# Patient Record
Sex: Male | Born: 1955 | Race: White | Hispanic: No | Marital: Married | Smoking: Former smoker
Health system: Southern US, Community
[De-identification: ages and names within clinical notes are randomized; demographics above are authoritative.]

## PROBLEM LIST (undated history)

## (undated) DIAGNOSIS — I429 Cardiomyopathy, unspecified: Secondary | ICD-10-CM

## (undated) DIAGNOSIS — I2510 Atherosclerotic heart disease of native coronary artery without angina pectoris: Secondary | ICD-10-CM

## (undated) DIAGNOSIS — I639 Cerebral infarction, unspecified: Secondary | ICD-10-CM

## (undated) DIAGNOSIS — F1010 Alcohol abuse, uncomplicated: Secondary | ICD-10-CM

## (undated) DIAGNOSIS — Z720 Tobacco use: Secondary | ICD-10-CM

## (undated) DIAGNOSIS — E119 Type 2 diabetes mellitus without complications: Secondary | ICD-10-CM

## (undated) DIAGNOSIS — I5022 Chronic systolic (congestive) heart failure: Secondary | ICD-10-CM

## (undated) HISTORY — PX: HERNIA REPAIR: SHX51

## (undated) HISTORY — DX: Morbid (severe) obesity due to excess calories: E66.01

## (undated) HISTORY — DX: Type 2 diabetes mellitus without complications: E11.9

## (undated) HISTORY — DX: Tobacco use: Z72.0

## (undated) HISTORY — DX: Cardiomyopathy, unspecified: I42.9

## (undated) HISTORY — DX: Chronic systolic (congestive) heart failure: I50.22

## (undated) HISTORY — DX: Cerebral infarction, unspecified: I63.9

## (undated) HISTORY — DX: Alcohol abuse, uncomplicated: F10.10

## (undated) HISTORY — DX: Atherosclerotic heart disease of native coronary artery without angina pectoris: I25.10

---

## 2005-07-29 ENCOUNTER — Emergency Department: Payer: Self-pay | Admitting: Emergency Medicine

## 2005-08-07 ENCOUNTER — Other Ambulatory Visit: Payer: Self-pay

## 2005-08-10 ENCOUNTER — Ambulatory Visit: Payer: Self-pay | Admitting: Surgery

## 2008-04-16 IMAGING — CT CT ABD-PELV W/ CM
1 of 3 series · 14 of 32 positions shown, 19 images · non-contrast
Comparison: none

REASON FOR EXAM: Abdominal pain PO/IV contrast
COMMENTS:

[Series 2: abdomen · axial · 0.79mm/px · z∈[-598,-166]mm · 14 of 62 slices shown, 19 images]
[im 4/62  soft-tissue]
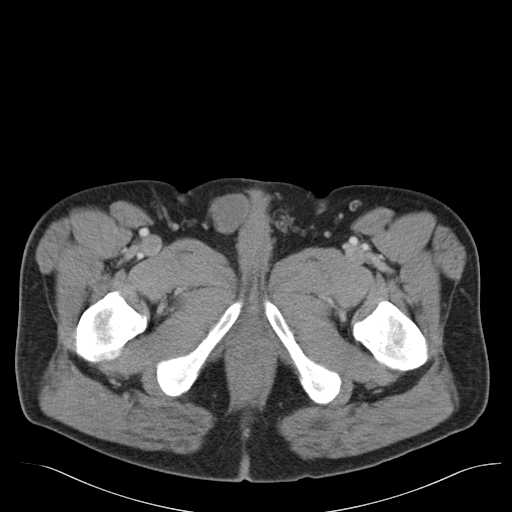
[im 4/62  bone]
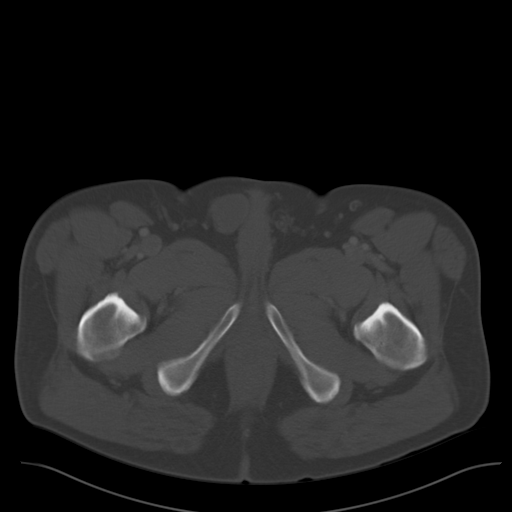
[im 7/62  soft-tissue]
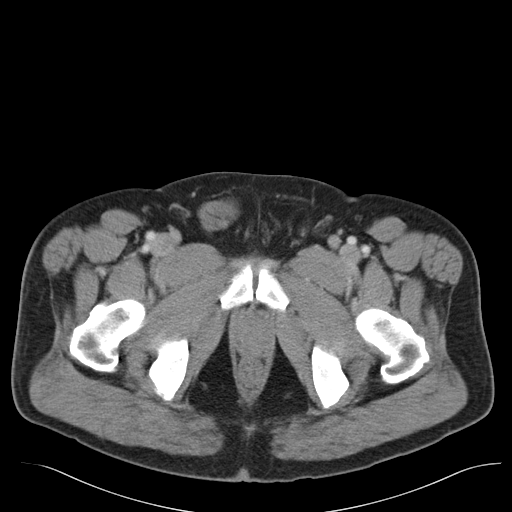
[im 14/62  soft-tissue]
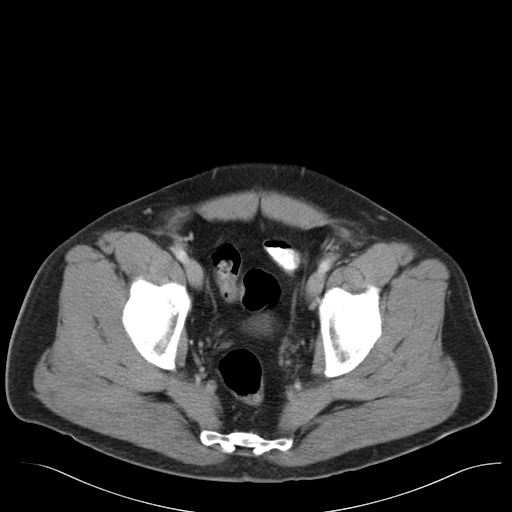
[im 17/62  soft-tissue]
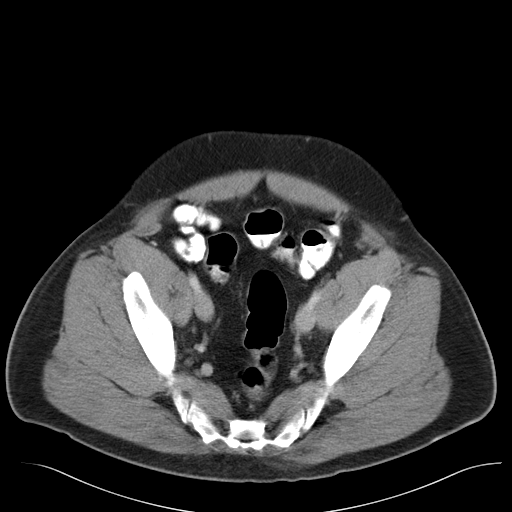
[im 21/62  soft-tissue]
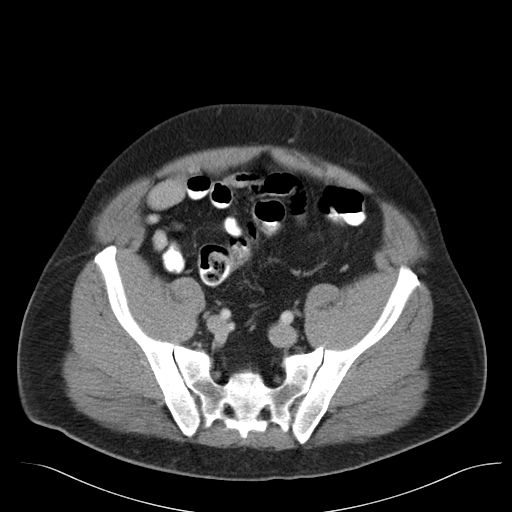
[im 28/62  soft-tissue]
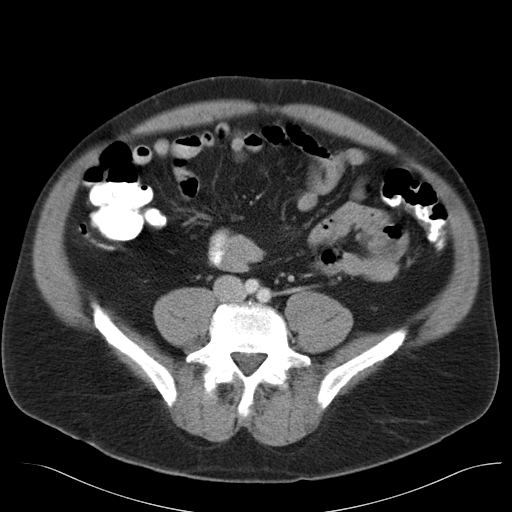
[im 31/62  soft-tissue]
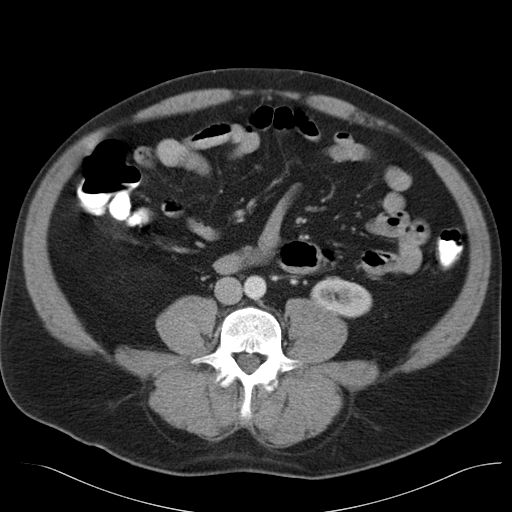
[im 34/62  soft-tissue]
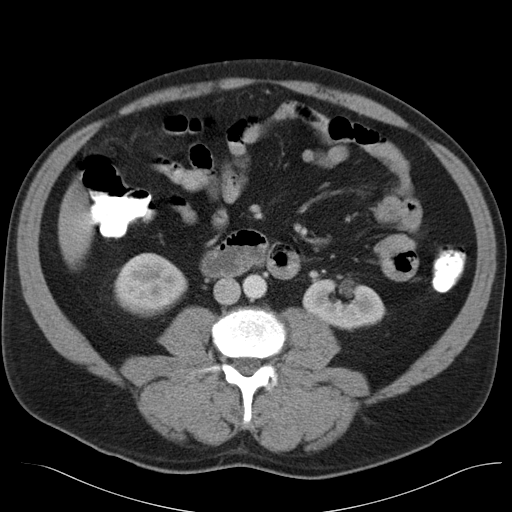
[im 41/62  soft-tissue]
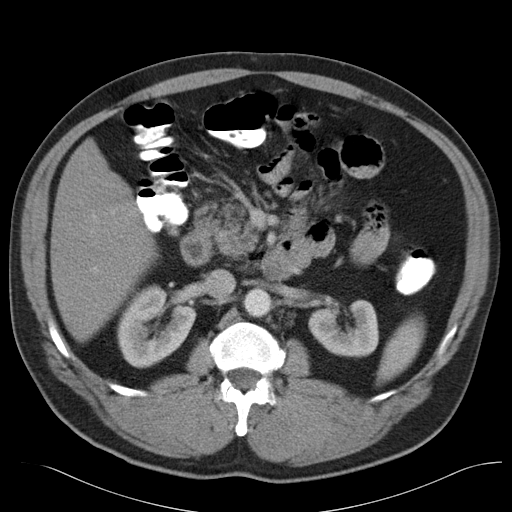
[im 41/62  bone]
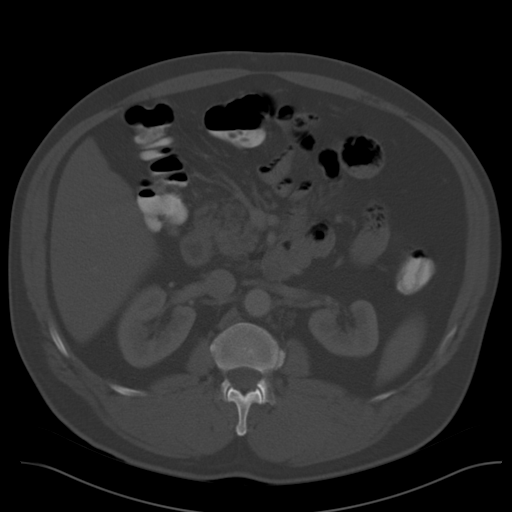
[im 45/62  soft-tissue]
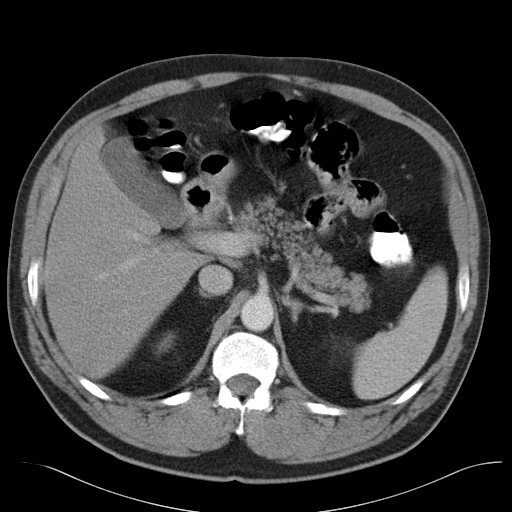
[im 48/62  soft-tissue]
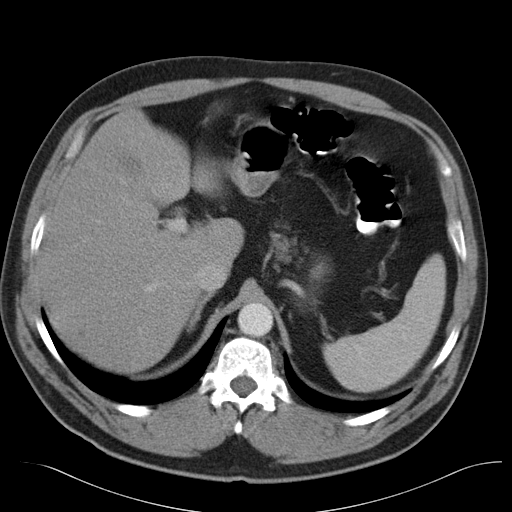
[im 48/62  lung]
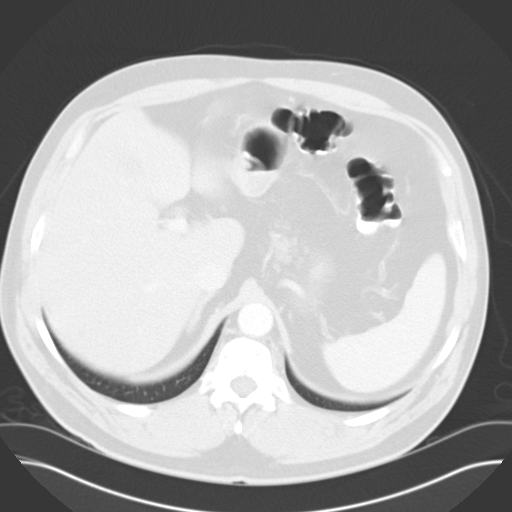
[im 51/62  lung]
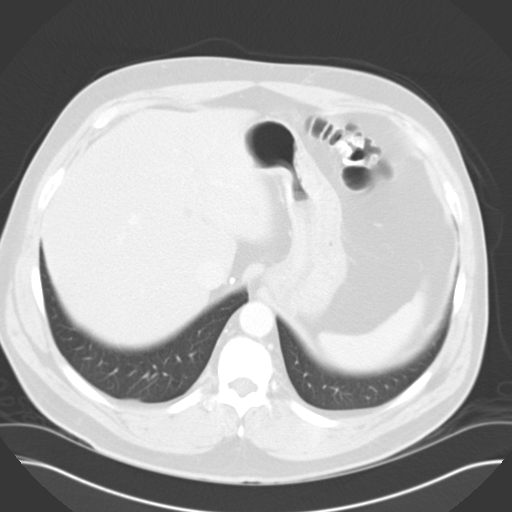
[im 55/62  soft-tissue]
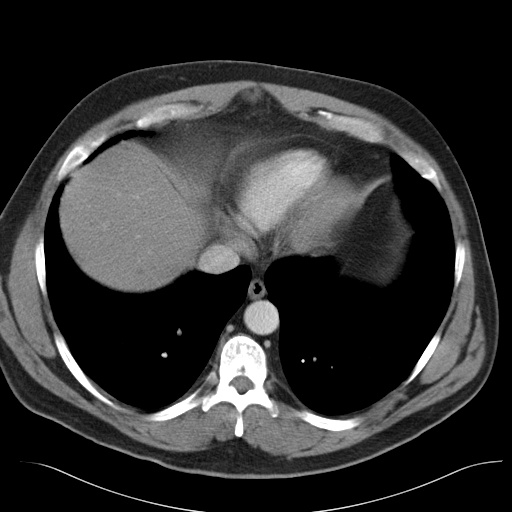
[im 55/62  lung]
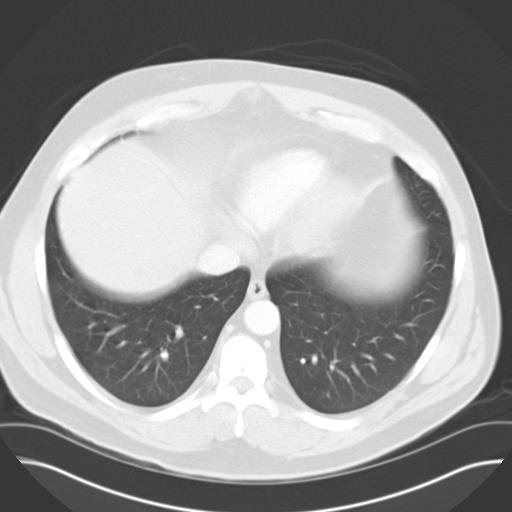
[im 58/62  soft-tissue]
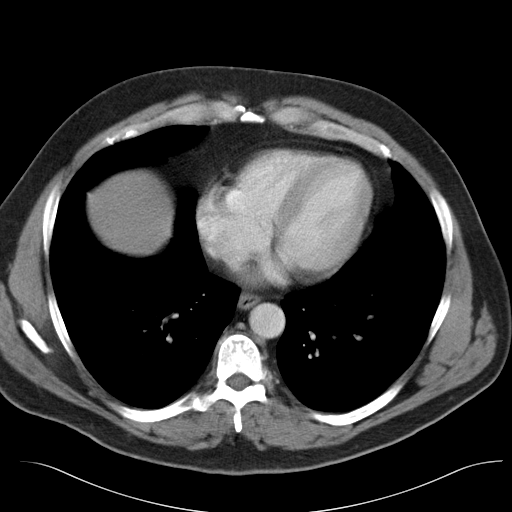
[im 58/62  lung]
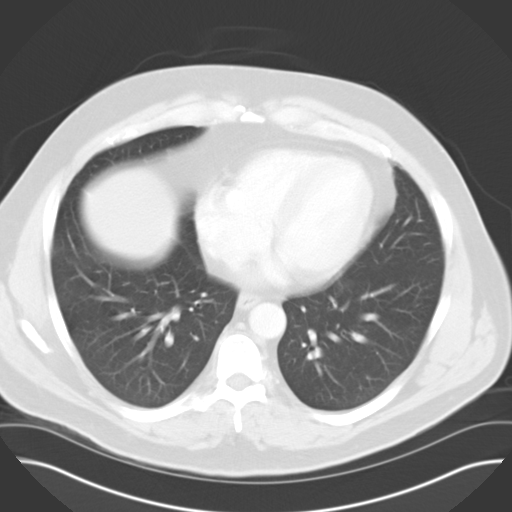

[14 of 32 positions shown; findings below may reference images not displayed]

PROCEDURE:     CT  - CT ABDOMEN / PELVIS  W  - July 29, 2005  [DATE]

RESULT:     IV and oral contrast-enhanced CT scan of the abdomen and pelvis
is performed emergently.  The patient has no prior examination for
comparison.  There is some density in the RIGHT inguinal canal which could
represent an incompletely descended testis or some fluid and fat within a
hernia.  Clinical correlation is recommended.  No abnormal bowel distention
is seen.  There is a fat-containing periumbilical hernia.  The kidneys
enhance normally without evidence of a mass.  No radiopaque gallstones are
seen.  No definite inflammatory changes are evident.  The lung bases are
clear.  There is some fatty infiltration of the pancreas.  The aorta is
normal in caliber. The lung bases are clear.
IMPRESSION: Periumbilical hernia.

Possibly incompletely descended testicle on the RIGHT versus some fluid
within a RIGHT inguinal hernia.  Clinical correlation and follow-up is
recommended.

Not mentioned above are changes of diverticulosis.

No definite evidence of diverticulitis or appendicitis.

## 2008-10-11 ENCOUNTER — Inpatient Hospital Stay: Payer: Self-pay | Admitting: Internal Medicine

## 2009-01-02 ENCOUNTER — Ambulatory Visit: Payer: Self-pay | Admitting: Unknown Physician Specialty

## 2009-10-19 IMAGING — CR DG ABDOMEN 3V
1 series · 4 of 4 positions shown · non-contrast
Comparison: none

REASON FOR EXAM: pain
COMMENTS:

[Series 1: view not recorded · 0.17mm/px · 4 of 4 slices shown]
[im 1/4]
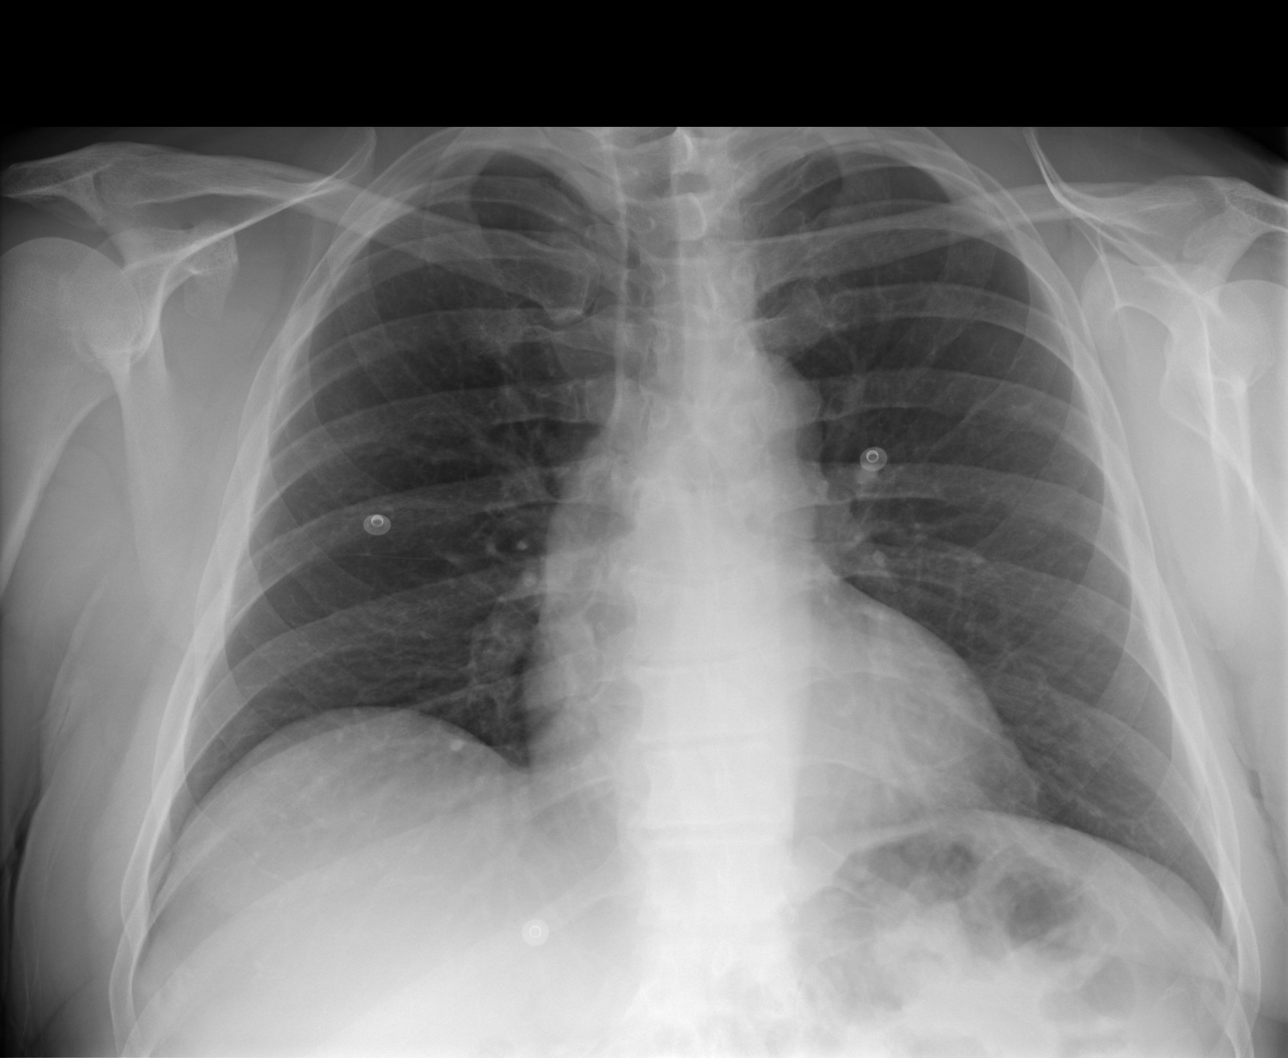
[im 2/4]
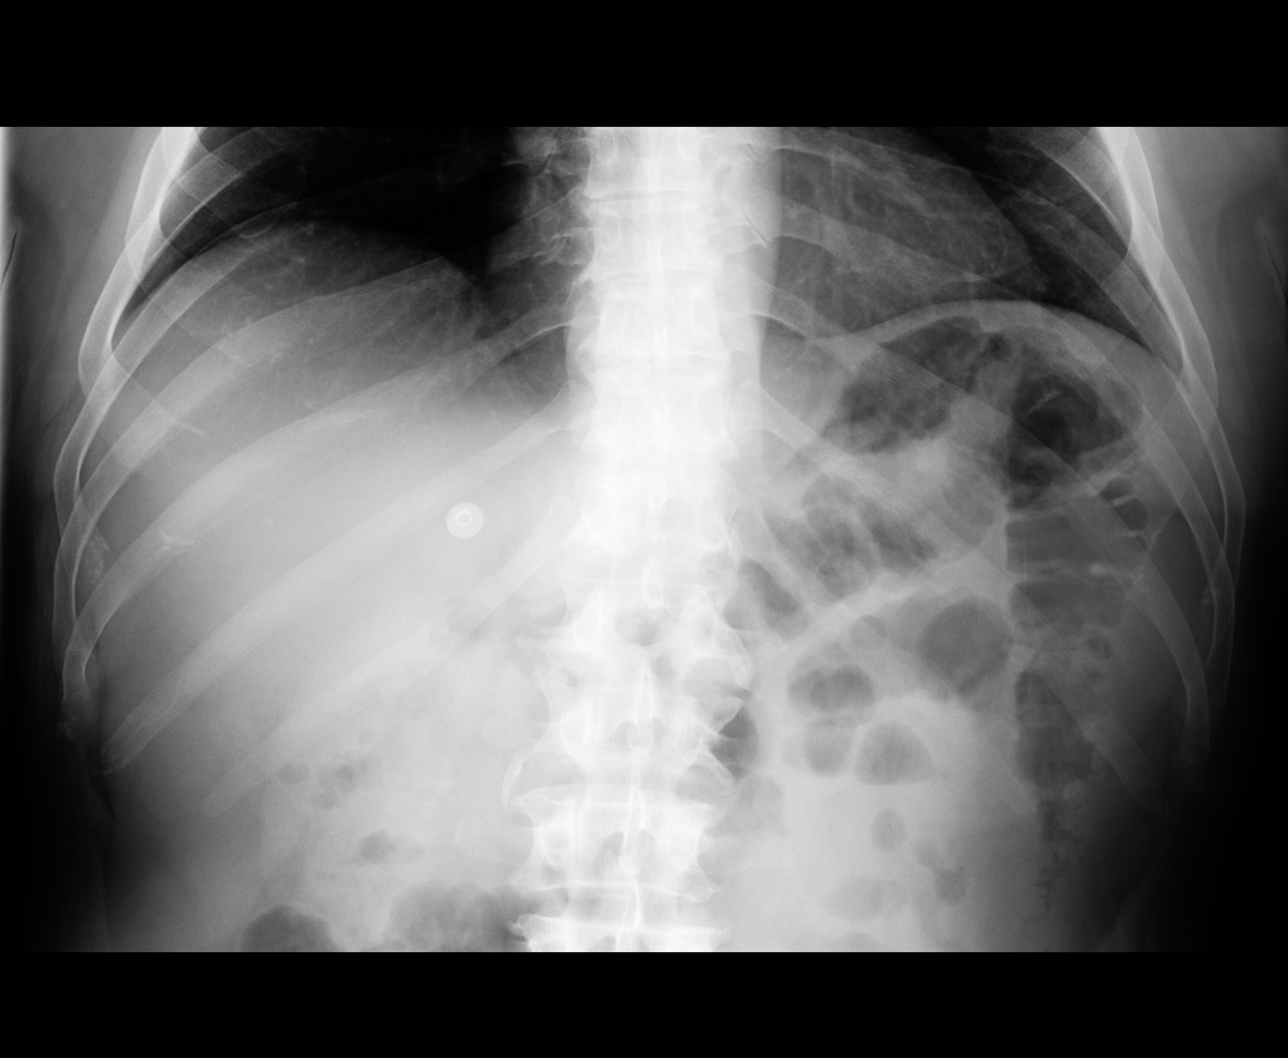
[im 3/4]
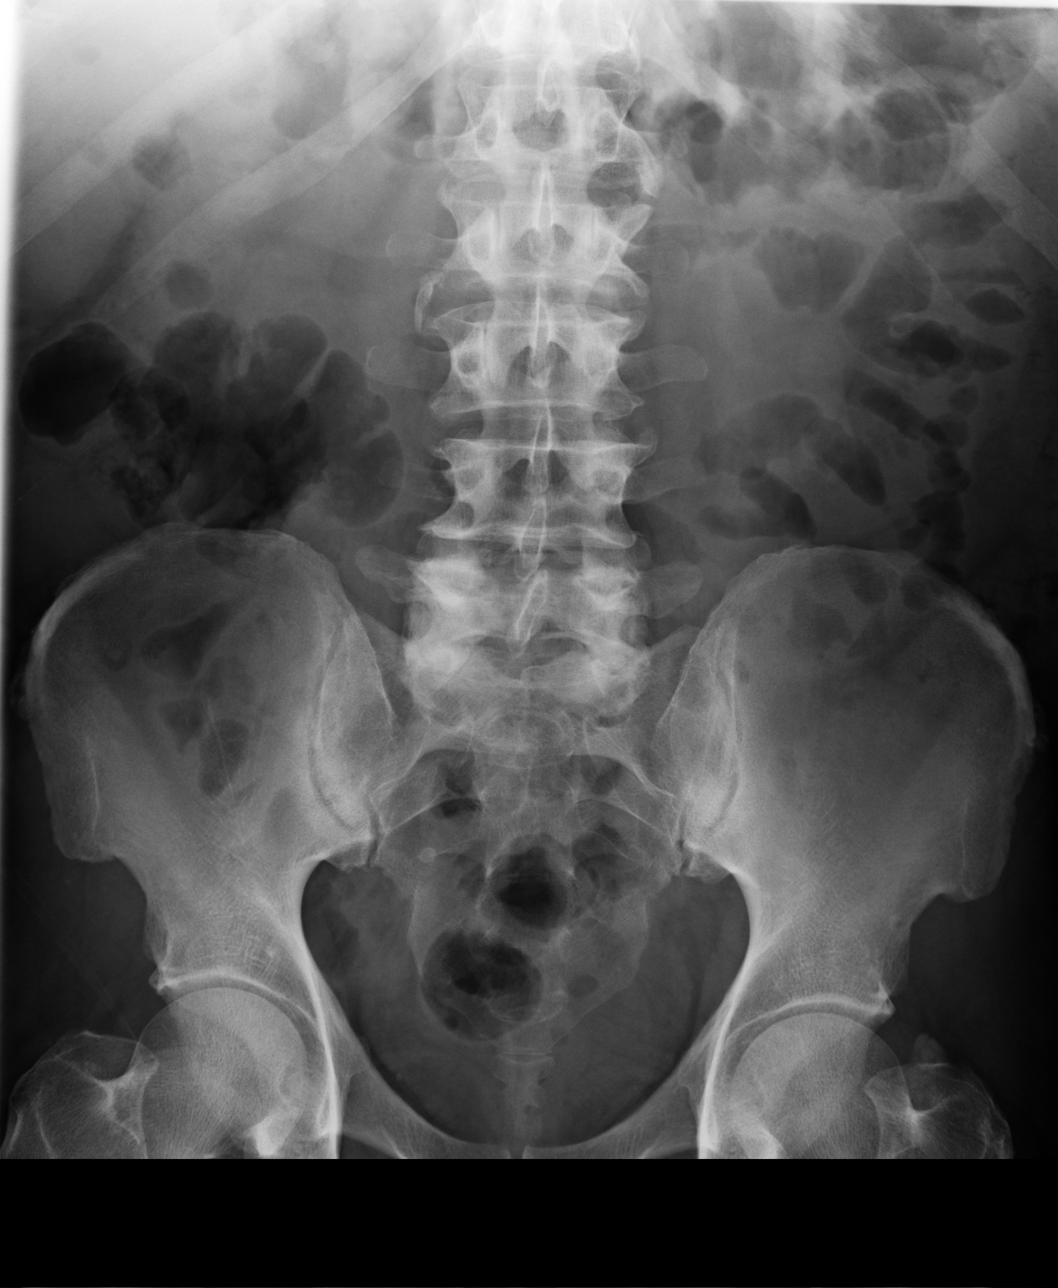
[im 4/4]
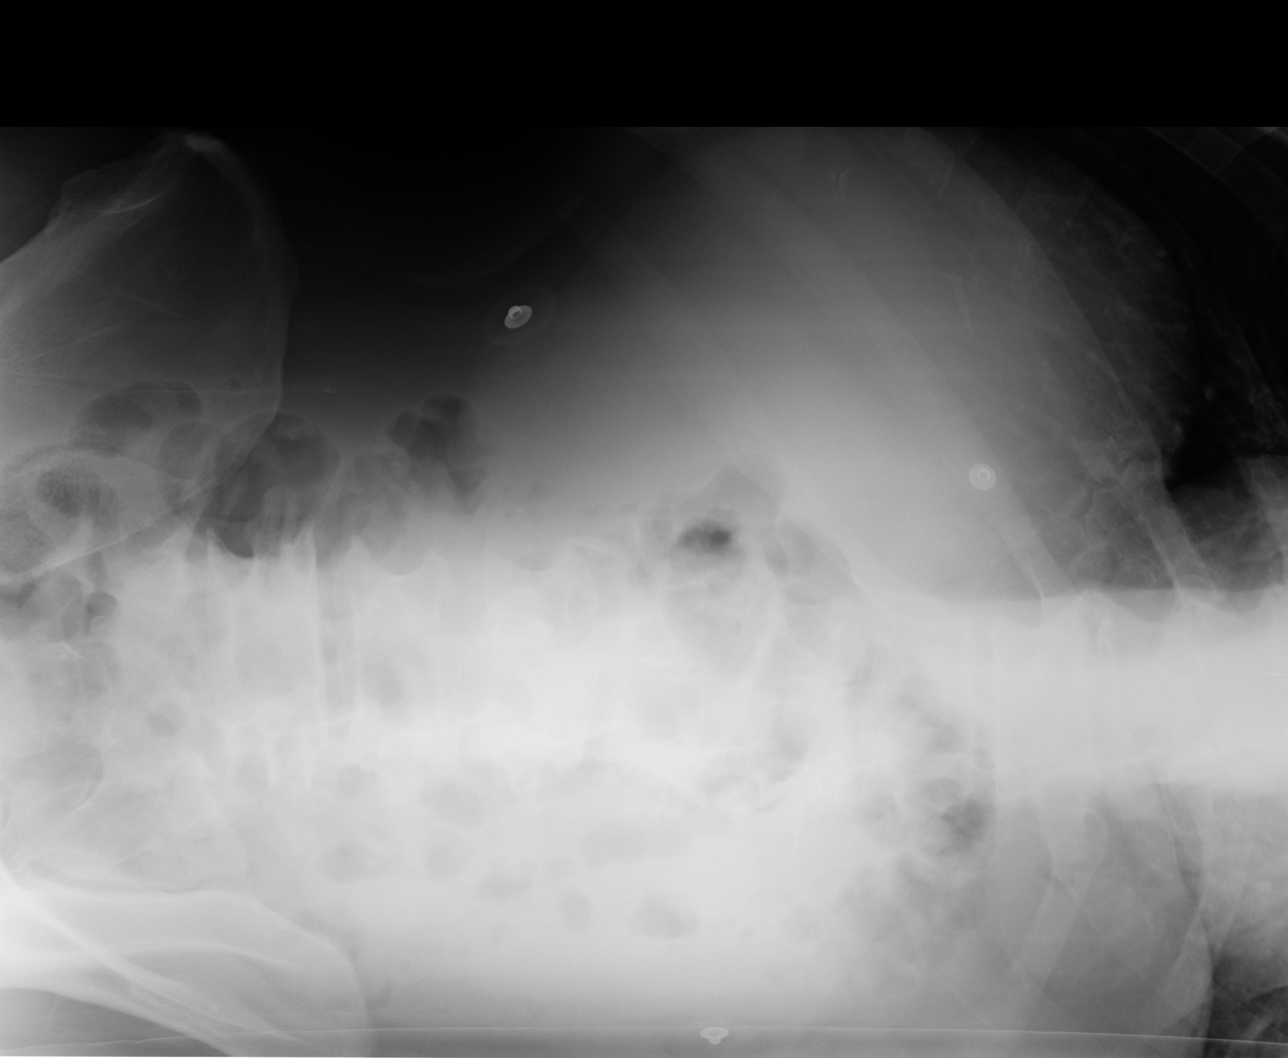

[4 of 4 positions shown; findings below may reference images not displayed]

PROCEDURE:     DXR - DXR ABDOMEN 3-WAY (INCL PA CXR)  - October 11, 2008 [DATE]

RESULT:     PA view of the chest shows the lung fields to be clear.

Three views of the abdomen were obtained. No subdiaphragmatic free air is
seen. The bowel gas pattern is normal. Gas is seen in both the large and
small bowel. No dilated loops of bowel are noted. In the erect view, there
are a few, small, scattered air/fluid levels in nondilated small bowel
loops. Such findings are nonspecific and are of the type frequently seen
with gastroenteritis, enteritis or intra-abdominal inflammation. No abnormal
intra-abdominal calcifications are noted. The osseous structures are normal
in appearance.
IMPRESSION: 1.  The lung fields are clear.
2.  No subdiaphragmatic free air is seen.
3.  There is no evidence of bowel obstruction.
4.  A few scattered small bowel fluid levels are noted in nondilated bowel
loops as mentioned above.

## 2011-11-05 ENCOUNTER — Ambulatory Visit: Payer: Self-pay | Admitting: Medical

## 2011-11-05 LAB — CBC WITH DIFFERENTIAL/PLATELET
Basophil #: 0.1 10*3/uL (ref 0.0–0.1)
Eosinophil #: 0.3 10*3/uL (ref 0.0–0.7)
Eosinophil %: 3.5 %
HGB: 16.2 g/dL (ref 13.0–18.0)
Lymphocyte #: 2.2 10*3/uL (ref 1.0–3.6)
Lymphocyte %: 26 %
MCH: 29.9 pg (ref 26.0–34.0)
MCV: 90 fL (ref 80–100)
Monocyte #: 0.8 x10 3/mm (ref 0.2–1.0)
Monocyte %: 9 %
Neutrophil %: 60.7 %
Platelet: 263 10*3/uL (ref 150–440)
RBC: 5.43 10*6/uL (ref 4.40–5.90)
RDW: 12.9 % (ref 11.5–14.5)

## 2011-11-05 LAB — COMPREHENSIVE METABOLIC PANEL
Albumin: 4.1 g/dL (ref 3.4–5.0)
Alkaline Phosphatase: 38 U/L — ABNORMAL LOW (ref 50–136)
Anion Gap: 13 (ref 7–16)
BUN: 21 mg/dL — ABNORMAL HIGH (ref 7–18)
Bilirubin,Total: 0.6 mg/dL (ref 0.2–1.0)
Calcium, Total: 9.1 mg/dL (ref 8.5–10.1)
Creatinine: 1.06 mg/dL (ref 0.60–1.30)
Glucose: 150 mg/dL — ABNORMAL HIGH (ref 65–99)
Potassium: 4.1 mmol/L (ref 3.5–5.1)
SGOT(AST): 29 U/L (ref 15–37)
Sodium: 140 mmol/L (ref 136–145)
Total Protein: 7.9 g/dL (ref 6.4–8.2)

## 2015-07-02 ENCOUNTER — Ambulatory Visit: Payer: Self-pay | Admitting: Podiatry

## 2015-07-18 ENCOUNTER — Ambulatory Visit (INDEPENDENT_AMBULATORY_CARE_PROVIDER_SITE_OTHER): Payer: BLUE CROSS/BLUE SHIELD

## 2015-07-18 ENCOUNTER — Ambulatory Visit (INDEPENDENT_AMBULATORY_CARE_PROVIDER_SITE_OTHER): Payer: BLUE CROSS/BLUE SHIELD | Admitting: Podiatry

## 2015-07-18 ENCOUNTER — Encounter: Payer: Self-pay | Admitting: Podiatry

## 2015-07-18 VITALS — BP 172/92 | HR 85 | Resp 18

## 2015-07-18 DIAGNOSIS — R52 Pain, unspecified: Secondary | ICD-10-CM

## 2015-07-18 DIAGNOSIS — M2021 Hallux rigidus, right foot: Secondary | ICD-10-CM | POA: Diagnosis not present

## 2015-07-18 NOTE — Progress Notes (Signed)
   Subjective:    Patient ID: Wesley Barry, male    DOB: 04/26/1955, 60 y.o.   MRN: [id number]  HPI  60 year old male presents the also concerns of pain to his right bunion which is been ongoing for the last several months. He is tried changing shoes without any relief in her times daily basis. He states that the toe does get red at times along the bunion site. Denies any recent injury or trauma. No swelling that he's notice. No numbness or tingling. No other complaints at this time.  Review of Systems  All other systems reviewed and are negative.      Objective:   Physical Exam General: AAO x3, NAD  Dermatological: There is mild irritation along the site of the bony prominence of the right bunion. There is no skin breakdown. No fluctuance or crepitus.  Vascular: Dorsalis Pedis artery and Posterior Tibial artery pedal pulses are 2/4 bilateral with immedate capillary fill time. Pedal hair growth present. There is no pain with calf compression, swelling, warmth, erythema.   Neruologic: Grossly intact via light touch bilateral. Vibratory intact via tuning fork bilateral. Protective threshold with Semmes Wienstein monofilament intact to all pedal sites bilateral.   Musculoskeletal: There is a significant decrease in first MPJ range of motion on the right side there is a dorsal prominence of the first metatarsal phalangeal joint. There is crepitation first MPJ range of motion and there is a minimal movement. There is tenderness on the entire first MPJ. There is no other areas of tenderness bilaterally. MMT 5/5, range of motion intact otherwise.  Gait: Unassisted, Nonantalgic.      Assessment & Plan:  60 year old male right hallux rigidus -Treatment options discussed including all alternatives, risks, and complications -Etiology of symptoms were discussed -X-rays were obtained and reviewed with the patient. 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 No evidence of acute  fracture. -I discussed with him both conservative and surgical treatment options. This point he will to have pursue a surgical intervention as he has tried over-the-counter treatment/conservative treatment without any relief. I discussed the most likely first MPJ arthrodesis. Discussed with him the postoperative course as well. He'll discuss this with work and figure out scheduling. He is to call the office for preop consult prior to surgery.  Wesley Barry, DPM

## 2015-07-21 ENCOUNTER — Encounter: Payer: Self-pay | Admitting: Podiatry

## 2015-07-22 ENCOUNTER — Telehealth: Payer: Self-pay | Admitting: *Deleted

## 2015-07-22 NOTE — Telephone Encounter (Signed)
"  I'm Odean's girlfriend.  He wanted him to call you and schedule an appointment for surgery."  He's going to need to schedule an appointment with Dr. Ardelle AntonWagoner for a consult so he can sign consent forms.  Then we can set up a surgery date.  Would you like to schedule that appointment? "Yes, I would."  I'm going to transfer you to a scheduler.

## 2015-07-30 ENCOUNTER — Ambulatory Visit: Payer: BLUE CROSS/BLUE SHIELD | Admitting: Podiatry

## 2015-07-30 ENCOUNTER — Encounter: Payer: Self-pay | Admitting: Podiatry

## 2019-02-03 DIAGNOSIS — Z8619 Personal history of other infectious and parasitic diseases: Secondary | ICD-10-CM

## 2019-02-03 HISTORY — DX: Personal history of other infectious and parasitic diseases: Z86.19

## 2021-05-09 ENCOUNTER — Ambulatory Visit: Payer: Self-pay | Admitting: Cardiology

## 2021-06-10 NOTE — Progress Notes (Signed)
Cardiology Office Note ? ?Date:  06/11/2021  ? ?ID:  Wesley Barry, DOB 1955/09/10, MRN [id number] ? ?PCP:  Patient, No Pcp Per (Inactive)  ? ?Chief Complaint  ?Patient presents with  ? New Patient (Initial Visit)  ?  Self referral for shortness of breath and fluttering in chest. Medications reviewed by the patient verbally.   ? ? ?HPI:  ?[name] is a 66 year old gentleman with past medical history of ?Prior smoker, 50-pack-year history, over 25 years ?Elevated glucose ?Obesity ?Shingles ?Who presents by self-referral for chest pain and fluttering ? ?No significant records in the epic system, followed at [facility] ?Records have been requested ? ?Reports recent chest pain symptoms ?Strong family history of coronary disease, father with CAD ?Presentation mixed secondary to recent history of shingles across the left chest extending under axilla to left posterior shoulder area ? ?Couples months ago, woke up heart was pounding lasted 1 minute and went away without intervention ?Has not had any further episodes since that time ? ?Denies significant leg swelling, no PND orthopnea ?Never been tested for sleep apnea, denies apnea symptoms ? ?Reports he has diet-controlled diabetes ?Does not know what his cholesterol is running, records requested ?Reports that he stopped smoking many years ago ? ?He is currently not on medications ?Does not check his blood pressure at home, running high in the office today ? ?EKG personally reviewed by myself on todays visit ?Normal sinus rhythm rate 93 bpm no significant ST-T wave changes ? ?PMH:   has a past medical history of History of shingles (2021). ? ?PSH:   No past surgical history ? ?Current Outpatient Medications  ?Medication Sig Dispense Refill  ? ivabradine (CORLANOR) 7.5 MG TABS tablet Take 2 tablets (15 mg total) by mouth once for 1 dose. Take 2 hours prior to your CT scan 2 tablet 0  ? metoprolol tartrate (LOPRESSOR) 100 MG tablet Take 1 tablet (100 mg total) by  mouth once for 1 dose. Take two hours prior to your CT. 1 tablet 0  ? ?No current facility-administered medications for this visit.  ? ? ?Allergies:   Tylenol with codeine #3 [acetaminophen-codeine]  ? ?Social History:  The patient  reports that he quit smoking about 19 years ago. His smoking use included cigarettes. He has a 50.00 pack-year smoking history. He does not have any smokeless tobacco history on file. He reports current alcohol use of about 18.0 standard drinks per week. He reports that he does not use drugs.  ? ?Family History:   family history includes Heart attack (age of onset: 92) in his father; Heart attack (age of onset: 55) in his mother.  ? ? ?Review of Systems: ?Review of Systems  ?Constitutional: Negative.   ?HENT: Negative.    ?Respiratory: Negative.    ?Cardiovascular:  Positive for chest pain.  ?Gastrointestinal: Negative.   ?Musculoskeletal: Negative.   ?Neurological: Negative.   ?Psychiatric/Behavioral: Negative.    ?All other systems reviewed and are negative. ? ? ?PHYSICAL EXAM: ?VS:  BP (!) 150/82 (BP Location: Right Arm, Patient Position: Sitting, Cuff Size: Normal)   Pulse 93   Ht 5\' 9"  (1.753 m)   Wt 258 lb (117 kg)   SpO2 97%   BMI 38.10 kg/m?  , BMI Body mass index is 38.1 kg/m?. ?GEN: Well nourished, well developed, in no acute distress ?HEENT: normal ?Neck: no JVD, carotid bruits, or masses ?Cardiac: RRR; no murmurs, rubs, or gallops,no edema  ?Respiratory:  clear to auscultation bilaterally, normal work  of breathing ?GI: soft, nontender, nondistended, + BS ?MS: no deformity or atrophy ?Skin: warm and dry, no rash ?Neuro:  Strength and sensation are intact ?Psych: euthymic mood, full affect ? ?Recent Labs: ?No results found for requested labs within last 8760 hours.  ? ? ?Lipid Panel ?No results found for: CHOL, HDL, LDLCALC, TRIG ?  ? ?Wt Readings from Last 3 Encounters:  ?06/11/21 258 lb (117 kg)  ?  ? ? ? ?ASSESSMENT AND PLAN: ? ?Problem List Items Addressed This Visit    ?None ?Visit Diagnoses   ? ? Chest pain of uncertain etiology    -  Primary  ? Relevant Orders  ? EKG 12-Lead  ? Basic Metabolic Panel (BMET)  ? Fluttering heart      ? Relevant Orders  ? EKG 12-Lead  ? Precordial pain      ? Relevant Orders  ? CT CORONARY MORPH W/CTA COR W/SCORE W/CA W/CM &/OR WO/CM  ? Angina pectoris (HCC)      ? Relevant Medications  ? metoprolol tartrate (LOPRESSOR) 100 MG tablet  ? ivabradine (CORLANOR) 7.5 MG TABS tablet  ? Elevated blood pressure reading      ? ?  ? ?Chest pain/angina ?Strong family history of coronary disease, reports recent left chest pain concerning for angina ?Risk factors include long smoking history, borderline diabetes, ?Discussed first treatment options with him, we have ordered cardiac CTA ?He is not ready to schedule today, will call us back when he would like to schedule, instructions provided ?Long discussion concerning procedure itself ? ?Borderline diabetes ?We have encouraged continued exercise, careful diet management in an effort to lose weight. ?Lab work requested from primary care ? ?Elevated blood pressure reading ?Recommend he closely monitor blood pressure at home and call us if numbers run high ? ?Former smoker ?Denies COPD symptoms ?No significant shortness of breath ? ?Obesity ?Recommended calorie restriction for weight loss ? ? ? Total encounter time more than 60 minutes ? Greater than 50% was spent in counseling and coordination of care with the patient ? ? ? ?Signed, ?Dossie Arbour, M.D., Ph.D. ?[facility] [place], [place] ?[phone] ?

## 2021-06-11 ENCOUNTER — Ambulatory Visit: Payer: Medicare HMO | Admitting: Cardiovascular Disease

## 2021-06-11 ENCOUNTER — Encounter: Payer: Self-pay | Admitting: Cardiovascular Disease

## 2021-06-11 VITALS — BP 150/82 | HR 93 | Ht 69.0 in | Wt 258.0 lb

## 2021-06-11 DIAGNOSIS — I498 Other specified cardiac arrhythmias: Secondary | ICD-10-CM

## 2021-06-11 DIAGNOSIS — R072 Precordial pain: Secondary | ICD-10-CM | POA: Diagnosis not present

## 2021-06-11 DIAGNOSIS — I209 Angina pectoris, unspecified: Secondary | ICD-10-CM | POA: Diagnosis not present

## 2021-06-11 DIAGNOSIS — R079 Chest pain, unspecified: Secondary | ICD-10-CM

## 2021-06-11 DIAGNOSIS — R030 Elevated blood-pressure reading, without diagnosis of hypertension: Secondary | ICD-10-CM

## 2021-06-11 MED ORDER — IVABRADINE HCL 7.5 MG PO TABS
15.0000 mg | ORAL_TABLET | Freq: Once | ORAL | 0 refills | Status: AC
Start: 2021-06-11 — End: 2021-06-11

## 2021-06-11 MED ORDER — METOPROLOL TARTRATE 100 MG PO TABS: 100.0000 mg | ORAL_TABLET | Freq: Once | ORAL | 0 refills | Status: DC

## 2021-06-11 NOTE — Patient Instructions (Addendum)
Medication Instructions:  ?No changes ? ?If you need a refill on your cardiac medications before your next appointment, please call your pharmacy.  ? ?Lab work: ?Today: BMP ? ?[facility] Entr[facility] at [facility] ?1st desk on the right to check in, past the screening table ?Lab hours: Monday- Friday (7:30 am- 5:30 pm)  ? ?Testing/Procedures: ?Your cardiac CT will be scheduled at: ? ?[address] ?[place], [place] [address] ?[phone] ? ?Please arrive 15 mins early for check-in and test prep. ? ? ? ?Please follow these instructions carefully (unless otherwise directed): ? ? ? ?On the Night Before the Test: ?Be sure to Drink plenty of water. ?Do not consume any caffeinated/decaffeinated beverages or chocolate 12 hours prior to your test. ? ? ?On the Day of the Test: ?Drink plenty of water until 1 hour prior to the test. ?Do not eat any food 4 hours prior to the test. ?You may take your regular medications prior to the test.  ?Take metoprolol (Lopressor)100 mg and ivabradine (Corlanor) 15 mg two hours prior to test. ?FEMALES- please wear underwire-free bra if available, avoid dresses & tight clothing ? ?     ?After the Test: ?Drink plenty of water. ?After receiving IV contrast, you may experience a mild flushed feeling. This is normal. ?On occasion, you may experience a mild rash up to 24 hours after the test. This is not dangerous. If this occurs, you can take Benadryl 25 mg and increase your fluid intake. ?If you experience trouble breathing, this can be serious. If it is severe call [phone] IMMEDIATELY. If it is mild, please call our office. ? ?Please allow 2-4 weeks for scheduling of routine cardiac CTs. Some insurance companies require a pre-authorization which may delay scheduling of this test.  ? ?For non-scheduling related questions, please contact the cardiac imaging nurse navigator should you have any questions/concerns: ?Marchia Bond, Cardiac Imaging Nurse  Navigator ?Gordy Clement, Cardiac Imaging Nurse Navigator ?[facility] ?Direct Office Dial: [phone]  ? ?For scheduling needs, including cancellations and rescheduling, please call [place], [phone].   ? ?Follow-Up: ?At [facility], you and your health needs are our priority.  As part of our continuing mission to provide you with exceptional heart care, we have created designated Provider Care Teams.  These Care Teams include your primary Cardiologist (physician) and Advanced Practice Providers (APPs -  Physician Assistants and Nurse Practitioners) who all work together to provide you with the care you need, when you need it. ? ?You will need a follow up appointment as needed ? ?Providers on your designated Care Team:   ?Murray Hodgkins, NP ?Christell Faith, PA-C ?Cadence Kathlen Mody, PA-C ? ?COVID-19 Vaccine Information can be found at: [email or web address] For questions related to vaccine distribution or appointments, please email [email or web address] or call [phone].  ? ?

## 2021-07-10 ENCOUNTER — Encounter (HOSPITAL_COMMUNITY): Payer: Self-pay

## 2021-08-01 ENCOUNTER — Encounter (HOSPITAL_COMMUNITY): Payer: Self-pay

## 2022-06-16 ENCOUNTER — Emergency Department: Payer: Medicare HMO

## 2022-06-16 ENCOUNTER — Inpatient Hospital Stay (HOSPITAL_COMMUNITY)
Admit: 2022-06-16 | Discharge: 2022-06-16 | Disposition: A | Discharge status: Home / Self Care | Payer: Medicare HMO | Attending: Family Medicine | Admitting: Family Medicine

## 2022-06-16 ENCOUNTER — Inpatient Hospital Stay
Admission: EM | Admit: 2022-06-16 | Discharge: 2022-06-19 | DRG: 065 | Disposition: A | Discharge status: Skilled Nursing Facility | Payer: Medicare HMO | Attending: Internal Medicine | Admitting: Internal Medicine

## 2022-06-16 ENCOUNTER — Inpatient Hospital Stay: Payer: Medicare HMO

## 2022-06-16 ENCOUNTER — Encounter: Payer: Self-pay | Admitting: Emergency Medicine

## 2022-06-16 DIAGNOSIS — R29810 Facial weakness: Secondary | ICD-10-CM | POA: Diagnosis present

## 2022-06-16 DIAGNOSIS — R918 Other nonspecific abnormal finding of lung field: Secondary | ICD-10-CM

## 2022-06-16 DIAGNOSIS — E118 Type 2 diabetes mellitus with unspecified complications: Secondary | ICD-10-CM

## 2022-06-16 DIAGNOSIS — I6389 Other cerebral infarction: Secondary | ICD-10-CM

## 2022-06-16 DIAGNOSIS — G8191 Hemiplegia, unspecified affecting right dominant side: Secondary | ICD-10-CM | POA: Diagnosis present

## 2022-06-16 DIAGNOSIS — R531 Weakness: Secondary | ICD-10-CM | POA: Diagnosis present

## 2022-06-16 DIAGNOSIS — Z6836 Body mass index (BMI) 36.0-36.9, adult: Secondary | ICD-10-CM

## 2022-06-16 DIAGNOSIS — Z79899 Other long term (current) drug therapy: Secondary | ICD-10-CM

## 2022-06-16 DIAGNOSIS — I10 Essential (primary) hypertension: Secondary | ICD-10-CM | POA: Diagnosis not present

## 2022-06-16 DIAGNOSIS — I6381 Other cerebral infarction due to occlusion or stenosis of small artery: Principal | ICD-10-CM | POA: Diagnosis present

## 2022-06-16 DIAGNOSIS — R739 Hyperglycemia, unspecified: Secondary | ICD-10-CM

## 2022-06-16 DIAGNOSIS — F1010 Alcohol abuse, uncomplicated: Secondary | ICD-10-CM

## 2022-06-16 DIAGNOSIS — I2510 Atherosclerotic heart disease of native coronary artery without angina pectoris: Secondary | ICD-10-CM | POA: Diagnosis present

## 2022-06-16 DIAGNOSIS — F10139 Alcohol abuse with withdrawal, unspecified: Secondary | ICD-10-CM | POA: Diagnosis present

## 2022-06-16 DIAGNOSIS — Z87891 Personal history of nicotine dependence: Secondary | ICD-10-CM

## 2022-06-16 DIAGNOSIS — Z794 Long term (current) use of insulin: Secondary | ICD-10-CM | POA: Diagnosis not present

## 2022-06-16 DIAGNOSIS — Z885 Allergy status to narcotic agent status: Secondary | ICD-10-CM

## 2022-06-16 DIAGNOSIS — E785 Hyperlipidemia, unspecified: Secondary | ICD-10-CM | POA: Diagnosis present

## 2022-06-16 DIAGNOSIS — R29703 NIHSS score 3: Secondary | ICD-10-CM | POA: Diagnosis present

## 2022-06-16 DIAGNOSIS — I429 Cardiomyopathy, unspecified: Secondary | ICD-10-CM | POA: Diagnosis present

## 2022-06-16 DIAGNOSIS — Z8249 Family history of ischemic heart disease and other diseases of the circulatory system: Secondary | ICD-10-CM | POA: Diagnosis not present

## 2022-06-16 DIAGNOSIS — Z8619 Personal history of other infectious and parasitic diseases: Secondary | ICD-10-CM | POA: Diagnosis not present

## 2022-06-16 DIAGNOSIS — R1310 Dysphagia, unspecified: Secondary | ICD-10-CM | POA: Diagnosis present

## 2022-06-16 DIAGNOSIS — I110 Hypertensive heart disease with heart failure: Secondary | ICD-10-CM | POA: Diagnosis present

## 2022-06-16 DIAGNOSIS — E1165 Type 2 diabetes mellitus with hyperglycemia: Secondary | ICD-10-CM

## 2022-06-16 DIAGNOSIS — R471 Dysarthria and anarthria: Secondary | ICD-10-CM | POA: Diagnosis present

## 2022-06-16 DIAGNOSIS — I5022 Chronic systolic (congestive) heart failure: Secondary | ICD-10-CM | POA: Diagnosis present

## 2022-06-16 DIAGNOSIS — E669 Obesity, unspecified: Secondary | ICD-10-CM | POA: Diagnosis present

## 2022-06-16 DIAGNOSIS — I639 Cerebral infarction, unspecified: Secondary | ICD-10-CM | POA: Diagnosis not present

## 2022-06-16 LAB — CBC
HCT: 47.4 % (ref 39.0–52.0)
Hemoglobin: 15.4 g/dL (ref 13.0–17.0)
MCH: 29 pg (ref 26.0–34.0)
MCHC: 32.5 g/dL (ref 30.0–36.0)
MCV: 89.3 fL (ref 80.0–100.0)
Platelets: 202 10*3/uL (ref 150–400)
RBC: 5.31 MIL/uL (ref 4.22–5.81)
RDW: 12.2 % (ref 11.5–15.5)
WBC: 4.9 10*3/uL (ref 4.0–10.5)
nRBC: 0 % (ref 0.0–0.2)

## 2022-06-16 LAB — HEMOGLOBIN A1C
Hgb A1c MFr Bld: 11.5 % — ABNORMAL HIGH (ref 4.8–5.6)
Mean Plasma Glucose: 283.35 mg/dL

## 2022-06-16 LAB — URINE DRUG SCREEN, QUALITATIVE (ARMC ONLY)
Amphetamines, Ur Screen: NOT DETECTED
Barbiturates, Ur Screen: NOT DETECTED
Benzodiazepine, Ur Scrn: NOT DETECTED
Cannabinoid 50 Ng, Ur ~~LOC~~: NOT DETECTED
Cocaine Metabolite,Ur ~~LOC~~: NOT DETECTED
MDMA (Ecstasy)Ur Screen: NOT DETECTED
Methadone Scn, Ur: NOT DETECTED
Opiate, Ur Screen: NOT DETECTED
Phencyclidine (PCP) Ur S: NOT DETECTED
Tricyclic, Ur Screen: NOT DETECTED

## 2022-06-16 LAB — PROTIME-INR
INR: 1 (ref 0.8–1.2)
Prothrombin Time: 13.5 seconds (ref 11.4–15.2)

## 2022-06-16 LAB — ECHOCARDIOGRAM COMPLETE
AR max vel: 4.09 cm2
AV Area VTI: 5.31 cm2
AV Area mean vel: 3.73 cm2
AV Mean grad: 2.5 mmHg
AV Peak grad: 3.8 mmHg
Ao pk vel: 0.97 m/s
Area-P 1/2: 4.36 cm2
Height: 69 in
MV VTI: 3.94 cm2
S' Lateral: 2.55 cm
Weight: 4160 oz

## 2022-06-16 LAB — COMPREHENSIVE METABOLIC PANEL
ALT: 47 U/L — ABNORMAL HIGH (ref 0–44)
AST: 35 U/L (ref 15–41)
Albumin: 3.8 g/dL (ref 3.5–5.0)
Alkaline Phosphatase: 21 U/L — ABNORMAL LOW (ref 38–126)
Anion gap: 12 (ref 5–15)
BUN: 13 mg/dL (ref 8–23)
CO2: 24 mmol/L (ref 22–32)
Calcium: 8.8 mg/dL — ABNORMAL LOW (ref 8.9–10.3)
Chloride: 98 mmol/L (ref 98–111)
Creatinine, Ser: 0.9 mg/dL (ref 0.61–1.24)
GFR, Estimated: >60 mL/min (ref >60)
Glucose, Bld: 332 mg/dL — ABNORMAL HIGH (ref 70–99)
Potassium: 3.6 mmol/L (ref 3.5–5.1)
Sodium: 134 mmol/L — ABNORMAL LOW (ref 135–145)
Total Bilirubin: 0.9 mg/dL (ref 0.3–1.2)
Total Protein: 7 g/dL (ref 6.5–8.1)

## 2022-06-16 LAB — DIFFERENTIAL
Abs Immature Granulocytes: 0.06 10*3/uL (ref 0.00–0.07)
Basophils Absolute: 0 10*3/uL (ref 0.0–0.1)
Basophils Relative: 1 %
Eosinophils Absolute: 0.1 10*3/uL (ref 0.0–0.5)
Eosinophils Relative: 2 %
Immature Granulocytes: 1 %
Lymphocytes Relative: 38 %
Lymphs Abs: 1.9 10*3/uL (ref 0.7–4.0)
Monocytes Absolute: 0.5 10*3/uL (ref 0.1–1.0)
Monocytes Relative: 11 %
Neutro Abs: 2.3 10*3/uL (ref 1.7–7.7)
Neutrophils Relative %: 47 %

## 2022-06-16 LAB — CBG MONITORING, ED
Glucose-Capillary: 314 mg/dL — ABNORMAL HIGH (ref 70–99)
Glucose-Capillary: 331 mg/dL — ABNORMAL HIGH (ref 70–99)
Glucose-Capillary: 233 mg/dL — ABNORMAL HIGH (ref 70–99)
Glucose-Capillary: 201 mg/dL — ABNORMAL HIGH (ref 70–99)
Glucose-Capillary: 209 mg/dL — ABNORMAL HIGH (ref 70–99)

## 2022-06-16 LAB — ETHANOL: Alcohol, Ethyl (B): <10 mg/dL (ref <10)

## 2022-06-16 LAB — APTT: aPTT: 27 seconds (ref 24–36)

## 2022-06-16 MED ORDER — MIDAZOLAM HCL 2 MG/2ML IJ SOLN: 2.0000 mg | Freq: Once | INTRAMUSCULAR | Status: DC

## 2022-06-16 MED ORDER — ADULT MULTIVITAMIN W/MINERALS CH
1.0000 | ORAL_TABLET | Freq: Every day | ORAL | Status: DC
  Administered 2022-06-17 – 2022-06-19 (×3): 1 via ORAL
  Filled 2022-06-16 (×4): qty 1

## 2022-06-16 MED ORDER — ASPIRIN 81 MG PO TBEC
81.0000 mg | DELAYED_RELEASE_TABLET | Freq: Every day | ORAL | Status: DC
  Administered 2022-06-17 – 2022-06-19 (×3): 81 mg via ORAL
  Filled 2022-06-16 (×3): qty 1

## 2022-06-16 MED ORDER — STROKE: EARLY STAGES OF RECOVERY BOOK: Freq: Once | Status: DC

## 2022-06-16 MED ORDER — THIAMINE MONONITRATE 100 MG PO TABS
100.0000 mg | ORAL_TABLET | Freq: Every day | ORAL | Status: DC
  Administered 2022-06-17 – 2022-06-19 (×3): 100 mg via ORAL
  Filled 2022-06-16 (×3): qty 1

## 2022-06-16 MED ORDER — ATORVASTATIN CALCIUM 20 MG PO TABS
40.0000 mg | ORAL_TABLET | Freq: Every day | ORAL | Status: DC
  Administered 2022-06-17: 40 mg via ORAL
  Filled 2022-06-16 (×2): qty 2

## 2022-06-16 MED ORDER — FOLIC ACID 1 MG PO TABS
1.0000 mg | ORAL_TABLET | Freq: Every day | ORAL | Status: DC
  Administered 2022-06-17 – 2022-06-19 (×3): 1 mg via ORAL
  Filled 2022-06-16 (×4): qty 1

## 2022-06-16 MED ORDER — CLOPIDOGREL BISULFATE 75 MG PO TABS
75.0000 mg | ORAL_TABLET | Freq: Every day | ORAL | Status: DC
  Administered 2022-06-17 – 2022-06-19 (×3): 75 mg via ORAL
  Filled 2022-06-16 (×3): qty 1

## 2022-06-16 MED ORDER — LORAZEPAM 1 MG PO TABS
1.0000 mg | ORAL_TABLET | ORAL | Status: AC | PRN
  Administered 2022-06-18: 2 mg via ORAL
  Filled 2022-06-16: qty 2

## 2022-06-16 MED ORDER — CLOPIDOGREL BISULFATE 75 MG PO TABS
300.0000 mg | ORAL_TABLET | Freq: Once | ORAL | Status: AC
  Administered 2022-06-16: 300 mg via ORAL
  Filled 2022-06-16: qty 4

## 2022-06-16 MED ORDER — LABETALOL HCL 5 MG/ML IV SOLN: 10.0000 mg | INTRAVENOUS | Status: DC | PRN

## 2022-06-16 MED ORDER — ATORVASTATIN CALCIUM 20 MG PO TABS: 80.0000 mg | ORAL_TABLET | Freq: Every day | ORAL | Status: DC

## 2022-06-16 MED ORDER — ASPIRIN 81 MG PO CHEW
324.0000 mg | CHEWABLE_TABLET | Freq: Once | ORAL | Status: AC
  Administered 2022-06-16: 324 mg via ORAL
  Filled 2022-06-16: qty 4

## 2022-06-16 MED ORDER — IOHEXOL 350 MG/ML SOLN
100.0000 mL | Freq: Once | INTRAVENOUS | Status: AC | PRN
  Administered 2022-06-16: 100 mL via INTRAVENOUS

## 2022-06-16 MED ORDER — THIAMINE HCL 100 MG/ML IJ SOLN
100.0000 mg | Freq: Every day | INTRAMUSCULAR | Status: DC
  Administered 2022-06-16: 100 mg via INTRAVENOUS
  Filled 2022-06-16: qty 2

## 2022-06-16 MED ORDER — LORAZEPAM 2 MG/ML IJ SOLN
1.0000 mg | INTRAMUSCULAR | Status: AC | PRN
  Administered 2022-06-16: 1 mg via INTRAVENOUS
  Administered 2022-06-16: 2 mg via INTRAVENOUS
  Administered 2022-06-18: 1 mg via INTRAVENOUS
  Filled 2022-06-16 (×3): qty 1

## 2022-06-16 MED ORDER — INSULIN ASPART 100 UNIT/ML IJ SOLN
0.0000 [IU] | INTRAMUSCULAR | Status: DC
  Administered 2022-06-16: 5 [IU] via SUBCUTANEOUS
  Administered 2022-06-16: 11 [IU] via SUBCUTANEOUS
  Administered 2022-06-16 – 2022-06-17 (×5): 5 [IU] via SUBCUTANEOUS
  Administered 2022-06-17: 8 [IU] via SUBCUTANEOUS
  Administered 2022-06-18: 3 [IU] via SUBCUTANEOUS
  Administered 2022-06-18: 5 [IU] via SUBCUTANEOUS
  Administered 2022-06-18 (×2): 3 [IU] via SUBCUTANEOUS
  Administered 2022-06-18: 5 [IU] via SUBCUTANEOUS
  Administered 2022-06-18: 3 [IU] via SUBCUTANEOUS
  Administered 2022-06-19: 5 [IU] via SUBCUTANEOUS
  Administered 2022-06-19: 3 [IU] via SUBCUTANEOUS
  Administered 2022-06-19: 5 [IU] via SUBCUTANEOUS
  Administered 2022-06-19 (×2): 3 [IU] via SUBCUTANEOUS
  Administered 2022-06-19: 5 [IU] via SUBCUTANEOUS
  Filled 2022-06-16 (×20): qty 1

## 2022-06-16 NOTE — ED Notes (Signed)
Provider at bedside talking to son

## 2022-06-16 NOTE — Evaluation (Signed)
Clinical/Bedside Swallow Evaluation Patient Details  Name: Wesley Barry MRN: [id number] Date of Birth: [birth year]  Today's Date: 06/16/2022 Time: SLP Start Time (ACUTE ONLY): 1515 SLP Stop Time (ACUTE ONLY): 1615 SLP Time Calculation (min) (ACUTE ONLY): 60 min  Past Medical History:  Past Medical History:  Diagnosis Date   History of shingles 2021   Past Surgical History:  Past Surgical History:  Procedure Laterality Date   HERNIA REPAIR     HPI:  Pt is a 67 y.o. male with medical history significant of Alcohol use, Obesity, remote history of tobacco use, history of shingles presenting with new CVA.  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    Assessment / Plan / Recommendation  Clinical Impression   Pt seen for BSE today. pt awake, yawning often. MOD+ Dysarthria impacting speech intelligibility. Pt followed 1-step instructions; repetition tasks. Eager for something to drink. Son present.  On [place] O2 support 1L; afebrile. WBC wnl.  Pt appears to present w/ oropharyngeal phase dysphagia w/ suspected sensorimotor deficits s/p new CVA. Pt is at increased risk for aspiration/aspiration pneumonia. Pt has a Baseline of ETOH abuse/use; unsure of impact on Cognition along w/ the new L CVA. He exhibits MOD+ R oral motor weakness, Dysarthria. Pt required MIN+ tactile/verbal/visual cues for follow through w/ tasks, and swallowing strategies.    During po trials post positioning pt more forward in bed, he consumed trials of single ice chips, Nectar liquids via TSP, and purees. Immediate, overt clinical s/s of aspiration (coughing) noted w/ trials of ice chips; coughing noted w/ sips of thin liquids during [facility] swallow screen by NSG. USING A HEAD TURN RIGHT w/ trials of Nectar liquids by TSP and trials of purees, No immediate, overt clinical s/s  of aspiration were noted: no cough, no decline in respiratory presentation noted during/post trials, clear vocal quality post trials. O2 sats remained 98%. Suspect decreased attention to task as well as potential delayed pharyngeal swallowin initiation could have impacted safety and success of pharyngeal swallow w/ thin liquids. Oral phase was c/b min decreased labial closure on spoon, and slightly slower bolus management but overall functional for A-P transfer and oral clearing of boluses given. Time b/t trials given for pt to fully clear any saliva w/ a Dry swallow, lingual sweeping.  OM exam revealed R lingual/labial unilateral weakness w/ lingual deviation to the R.   Recommend initiation of a dysphagia level 1 diet(purees) w/ Nectar consistency liquids; strict aspiration precautions; Head Turn RIGHT swallow strategy during all po's; time b/t trials for Dry swallowing. Feeding support and Supervision for follow through. Pills Crushed in puree for safety. Reduce Distractions during meals and check for oral clearing during/post intake as needed. NSG/MD updated.  SLP Visit Diagnosis: Dysphagia, oropharyngeal phase (R13.12)    Aspiration Risk  Moderate aspiration risk;Risk for inadequate nutrition/hydration    Diet Recommendation   dysphagia level 1 diet(purees) w/ Nectar consistency liquids; strict aspiration precautions; Head Turn RIGHT swallow strategy during all po's; time b/t trials for Dry swallowing. Feeding support and Supervision for follow through. Reduce Distractions during meals and check for oral clearing during/post intake as needed.   Medication Administration: Crushed with puree    Other  Recommendations Recommended Consults:  (Dietician f/u) Oral Care Recommendations: Oral care BID;Oral care prior to ice chip/H20;Oral care before and after PO;Staff/trained caregiver to provide oral care Caregiver Recommendations:  Avoid jello, ice cream, thin soups, popsicles;Remove water pitcher;Have  oral suction available    Recommendations for follow up therapy are one component of a multi-disciplinary discharge planning process, led by the attending physician.  Recommendations may be updated based on patient status, additional functional criteria and insurance authorization.  Follow up Recommendations Follow physician's recommendations for discharge plan and follow up therapies (next venue of care)      Assistance Recommended at Discharge  full  Functional Status Assessment Patient has had a recent decline in their functional status and demonstrates the ability to make significant improvements in function in a reasonable and predictable amount of time.  Frequency and Duration min 2x/week  2 weeks       Prognosis Prognosis for improved oropharyngeal function: Fair Barriers to Reach Goals: Time post onset;Severity of deficits;Behavior Barriers/Prognosis Comment: ETOH abuse/use      Swallow Study   General Date of Onset: 06/16/22 HPI: Pt is a 67 y.o. male with medical history significant of Alcohol use, Obesity, remote history of tobacco use, history of shingles presenting with new CVA.  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 Type of Study: Bedside Swallow Evaluation Previous Swallow Assessment: none Diet Prior to this Study: NPO Temperature Spikes Noted: No (wbc 4.9) Respiratory Status: Nasal cannula (1L) History of Recent Intubation: No Behavior/Cognition: Alert;Cooperative;Pleasant mood;Distractible;Requires cueing Oral Cavity Assessment: Dry Oral Care Completed by SLP: Yes Oral Cavity - Dentition: Missing dentition;Poor condition Vision: Impaired for self-feeding (Right neglect) Self-Feeding Abilities: Needs assist;Needs set up;Total assist Patient Positioning: Upright in bed (needed positioning) Baseline Vocal  Quality: Low vocal intensity (Dysarthria) Volitional Cough: Strong Volitional Swallow: Able to elicit    Oral/Motor/Sensory Function Overall Oral Motor/Sensory Function: Moderate impairment Facial ROM: Reduced right;Suspected CN VII (facial) dysfunction Facial Symmetry: Abnormal symmetry right;Suspected CN VII (facial) dysfunction Facial Strength: Reduced right;Suspected CN VII (facial) dysfunction Lingual ROM: Reduced right;Suspected CN XII (hypoglossal) dysfunction Lingual Symmetry: Abnormal symmetry right;Suspected CN XII (hypoglossal) dysfunction (deviation to R) Lingual Strength: Reduced;Suspected CN XII (hypoglossal) dysfunction Velum:  (CNT) Mandible: Within Functional Limits   Ice Chips Ice chips: Impaired Oral Phase Impairments: Reduced lingual movement/coordination;Reduced labial seal (on spoon) Pharyngeal Phase Impairments: Cough - Immediate (severe)   Thin Liquid Thin Liquid: Not tested    Nectar Thick Nectar Thick Liquid: Impaired Presentation: Spoon (fed; 12+ trials) Oral Phase Impairments: Reduced labial seal;Reduced lingual movement/coordination Oral phase functional implications:  (fair) Pharyngeal Phase Impairments:  (no immediate cough or s/s)   Honey Thick Honey Thick Liquid: Not tested   Puree Puree: Impaired Presentation: Spoon (fed; 10+ trials) Oral Phase Impairments: Reduced labial seal;Reduced lingual movement/coordination Oral Phase Functional Implications:  (fair) Pharyngeal Phase Impairments:  (none)   Solid     Solid: Not tested         Jerilynn Som, MS, CCC-SLP Speech Language Pathologist Rehab Services; [facility] [phone] (ascom) Wynema Garoutte 06/16/2022,5:57 PM

## 2022-06-16 NOTE — ED Notes (Signed)
Sister and son of pt at bedside. Pt just got back from MRI. Son was crying and began yelling and cursing at this RN. Son demanding that this RN clean mud off pt feet. Informed son that cleaning mud off feet is not the first priority and that medical care needs to be provided first. Son demanding to know what happened. Explained to son that pt had a stroke and has R sided weakness and slurred speech. Provided wet washcloths to son to clean pt feet. Informed son that he may not yell or curse at this RN and that if he continues, security will be called and he will be escorted out.

## 2022-06-16 NOTE — Progress Notes (Signed)
[facility] bedside evaluation by nursing with noted worsening right-sided weakness Seems to be mildly worsening weakness on my evaluation also Noted status post Ativan in the setting of alcohol withdrawals Case discussed with on-call neurologist Dr. Wilford Corner Noted acute perforated infarct of left basal ganglia Per Dr. Wilford Corner, there may be some waxing and waning symptoms in the setting of this type of CVA Nevertheless, we will order CT head noncontrast x 1 to rule out any acute bleeding Dr. Wilford Corner will be following  Patient's son and family member also updated of patient's overall clinical condition at the bedside Family expressed understanding and also apologized to nursing for being aggressive.

## 2022-06-16 NOTE — ED Notes (Signed)
Received report from night RN. Pt was brought back to room from MRI. Pt is refusing MRI. Pt helped to stand and use urinal.

## 2022-06-16 NOTE — Assessment & Plan Note (Signed)
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

## 2022-06-16 NOTE — H&P (Addendum)
History and Physical    Patient: Wesley Barry:[id number] DOB: 03-24-1955 DOA: 06/16/2022 DOS: the patient was seen and examined on 06/16/2022 PCP: Patient, No Pcp Per  Patient coming from: Home  Chief Complaint:  Chief Complaint  Patient presents with   Code Stroke   HPI: Wesley Barry is a 67 y.o. male with medical history significant of alcohol use, obesity, remote history of tobacco use, history of shingles presenting with CVA.  Patient presented to the ER around 5 AM with noted new onset right-sided weakness.  Patient went to bed around 9 PM with no deficits.  No reports of chest pain or shortness of breath.  No nausea or vomiting.  Denies any prior history of stroke symptoms in the past.  No abdominal pain.  No diarrhea.  No headache or reported vision changes.  Positive slurred speech.  Remote history of tobacco use in the past per patient.  Does drink around 12 beers daily.  No reported liquor use.  No reported illicit drug use.  Does not see a doctor on a regular basis.  Was evaluated about a year ago by cardiology for chest pain.  Looks to have declined evaluation and workup. Presented to the ER afebrile, blood pressures 170s to 180s over 60s to 100s.  Satting well on room air.  White count 4.9, hemoglobin 15.4, platelets 202 creatinine 0.9, glucose 332, ALT 47.  Code stroke CT head grossly stable.  CTA head and neck negative for any large vessel occlusion but does show age-indeterminate occlusion of the nondominant atherosclerotic left vertebral artery V4 segment which is favored to be chronic.  Moderate left MCA M1 and bilateral PCA P2 segment stenosis.  MRI of the brain pending.  Patient formally evaluated by teleneurology with recommendation for aspirin and Plavix load as well as further stroke workup. Review of Systems: As mentioned in the history of present illness. All other systems reviewed and are negative. Past Medical History:  Diagnosis Date   History of shingles  2021   Past Surgical History:  Procedure Laterality Date   HERNIA REPAIR     Social History:  reports that he quit smoking about 20 years ago. His smoking use included cigarettes. He has a 50.00 pack-year smoking history. He does not have any smokeless tobacco history on file. He reports current alcohol use of about 18.0 standard drinks of alcohol per week. He reports that he does not use drugs.  Allergies  Allergen Reactions   Tylenol With Codeine #3 [Acetaminophen-Codeine]     Family History  Problem Relation Age of Onset   Heart attack Mother 50   Heart attack Father 75    Prior to Admission medications   Medication Sig Start Date End Date Taking? Authorizing Provider  metoprolol tartrate (LOPRESSOR) 100 MG tablet Take 1 tablet (100 mg total) by mouth once for 1 dose. Take two hours prior to your CT. 06/11/21 06/11/21  Antonieta Iba, MD    Physical Exam: Vitals:   06/16/22 0610 06/16/22 0731  BP: (!) 188/106 (!) 174/60  Pulse: (!) 102 93  Resp: (!) 98   Temp: 98.2 F (36.8 C)   TempSrc: Oral   SpO2: 95%   Weight: 117.9 kg   Height: 5\' 9"  (1.753 m)    Physical Exam Constitutional:      Appearance: He is obese.     Comments: Positive mild generalized perspiration  HENT:     Head: Normocephalic.     Nose: Nose normal.  Eyes:     Pupils: Pupils are equal, round, and reactive to light.  Cardiovascular:     Rate and Rhythm: Normal rate and regular rhythm.  Pulmonary:     Effort: Pulmonary effort is normal.  Abdominal:     General: Bowel sounds are normal.     Comments: Obese abdomen  Musculoskeletal:     Comments: Positive right-sided weakness in upper and lower extremities  Skin:    General: Skin is warm.  Neurological:     Comments: Positive right-sided weakness and right-sided facial droop   Psychiatric:        Mood and Affect: Mood normal.     Data Reviewed:  There are no new results to review at this time. CT HEAD CODE STROKE WO  CONTRAST Addendum: ADDENDUM REPORT: 06/16/2022 06:17   ADDENDUM:  Study discussed by telephone with Dr. Delton Prairie on 06/16/2022 0547  hours today.   Electronically Signed    By: Odessa Fleming M.D.    On: 06/16/2022 06:17 Narrative: CLINICAL DATA:  Code stroke. 67 year old male last known well 2100 hours.  EXAM: CT HEAD WITHOUT CONTRAST  TECHNIQUE: Contiguous axial images were obtained from the base of the skull through the vertex without intravenous contrast.  RADIATION DOSE REDUCTION: This exam was performed according to the departmental dose-optimization program which includes automated exposure control, adjustment of the mA and/or kV according to patient size and/or use of iterative reconstruction technique.  COMPARISON:  None Available.  FINDINGS: Brain: Cerebral volume is within normal limits for age. No midline shift, mass effect, or evidence of intracranial mass lesion. No ventriculomegaly. No acute intracranial hemorrhage identified.  Patchy mild bilateral cerebral white matter hypodensity. Perivascular space left inferior lentiform (normal variant). But mild heterogeneity also in the bilateral thalami and brainstem.  No cortically based acute infarct identified.  Vascular: Extensive Calcified atherosclerosis at the skull base. No suspicious intracranial vascular hyperdensity.  Skull: No acute osseous abnormality identified.  Sinuses/Orbits: Mild paranasal sinus mucosal thickening, primarily in the maxillary sinuses. Tympanic cavities and mastoids are clear.  Other: No gaze deviation, negative orbit and scalp soft tissues.  ASPECTS [facility] Stroke Program Early CT Score)  Total score (0-10 with 10 being normal): 10  IMPRESSION: 1. No acute cortically based infarct or acute intracranial hemorrhage identified. ASPECTS 10. 2. But evidence of some age indeterminate small vessel disease in both hemispheres.  Electronically Signed: By: Odessa Fleming M.D. On:  06/16/2022 05:43 CT ANGIO HEAD NECK W WO CM (CODE STROKE) CLINICAL DATA:  67 year old male with unilateral weakness, facial asymmetry since 2100 hours.  EXAM: CT ANGIOGRAPHY HEAD AND NECK  TECHNIQUE: Multidetector CT imaging of the head and neck was performed using the standard protocol during bolus administration of intravenous contrast. Multiplanar CT image reconstructions and MIPs were obtained to evaluate the vascular anatomy. Carotid stenosis measurements (when applicable) are obtained utilizing NASCET criteria, using the distal internal carotid diameter as the denominator.  RADIATION DOSE REDUCTION: This exam was performed according to the departmental dose-optimization program which includes automated exposure control, adjustment of the mA and/or kV according to patient size and/or use of iterative reconstruction technique.  CONTRAST:  OMNIPAQUE IOHEXOL 350 MG/ML SOLN  COMPARISON:  Plain head CT today 0540 hours. CT Perfusion at the same time reported separately.  FINDINGS: CTA NECK  Skeleton: Carious dentition. Cervical spine degeneration with degenerative C2-C3 facet ankylosis on the left. No acute osseous abnormality identified.  Upper chest: Confluent but sub solid right upper lobe peribronchial  opacity on both series 6, images 169 and 172. This has an infectious/inflammatory appearance. Visible left lung appears negative. Mediastinal lipomatosis. Visible central pulmonary arteries appear patent. No mediastinal lymphadenopathy.  Other neck: No acute finding.  Aortic arch: 3 vessel arch.  Mild Calcified aortic atherosclerosis.  Right carotid system: Mildly tortuous brachiocephalic artery and right CCA origin with mild plaque and no significant stenosis. More moderate soft and calcified plaque of the right CCA at the level of the larynx (series 6, image 134) but less than 50 % stenosis with respect to the distal vessel before the right carotid  bifurcation. At the bifurcation there is calcified atherosclerosis of the right ICA origin and bulb but less than 50 % stenosis with respect to the distal vessel. Right ICA remains patent to the skull base.  Left carotid system: Mildly tortuous left CCA with only mild plaque before the carotid bifurcation. Calcified left ICA origin and bulb with less than 50 % stenosis with respect to the distal vessel.  Vertebral arteries: Normal proximal right subclavian artery. Faint calcified plaque at the right vertebral artery origin without stenosis. Dominant appearing right vertebral artery is patent to the skull base with no additional plaque or stenosis.  Proximal left subclavian artery mild tortuosity and plaque without stenosis. Normal left vertebral artery origin on series 8, image 283. Non dominant left vertebral artery is mildly irregular throughout the neck but patent to the skull base with no significant stenosis identified.  CTA HEAD  Posterior circulation: Dominant right vertebral artery is patent and functionally supplies the basilar. Right PICA origin is normal. Right V4 calcified plaque distal to the PICA is prominent but results in less than 50 % stenosis with respect to the distal vessel.  Contralateral non dominant left vertebral artery is occluded in the distal V4 segment, in an area of calcified plaque (series 9, image 118). Left PICA origin remains patent.  Basilar artery is patent with mild proximal 3rd basilar irregularity and mild stenosis. Basilar tip, SCA and PCA origins are patent. Posterior communicating arteries are diminutive or absent. Mild to moderate irregularity and stenosis of the left PCA P2 segment on series 13 image 25. And similar right PCA proximal P2 segment appearance on image 17. Bilateral distal PCA branches remain patent.  Anterior circulation: Both ICA siphons are patent. Left siphon calcified plaque is moderate to severe in the  supraclinoid segment with associated moderate to severe supraclinoid stenosis on series 9, image 84. Similar contralateral right ICA supraclinoid segment predominant calcified plaque, moderate to severe supraclinoid stenosis on series 9, image 83. But there is also conspicuous right petrous ICA soft and calcified plaque resulting in mild stenosis.  Patent carotid termini, MCA and ACA origins. Mildly tortuous A1 segments. Normal anterior communicating artery. Bilateral ACA branches are within normal limits.  Left MCA M1 segment is patent with mild to moderate irregularity and stenosis on series 11, image 21. Patent left MCA bifurcation. No left MCA branch occlusion is identified but there is up to moderate multifocal M2 and M3 branch irregularity and stenosis on series 13, image 29.  Contralateral right MCA M1 is patent with mild irregularity and stenosis. Patent right MCA bifurcation without stenosis. No right MCA branch occlusion identified, and comparatively mild right MCA branch irregularity.  Venous sinuses: Early contrast timing, not evaluated.  Anatomic variants: Dominant right vertebral artery.  Review of the MIP images confirms the above findings  IMPRESSION: 1. Negative for large vessel occlusion. 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 Legrand Rams this is chronic based on plain Head CT and CT Perfusion (reported separately). - other fairly Extensive Intracranial Atherosclerosis with - Severe bilateral supraclinoid ICA stenoses. - up to Moderate Left MCA M1 stenosis, bilateral PCA P2 segment stenoses. - and left > right mild to moderate MCA branch irregularity and stenosis.  2. Bilateral carotid bifurcation and right vertebral V4 segment calcified atherosclerosis without hemodynamically significant stenosis. Aortic Atherosclerosis (ICD10-I70.0).  3. Partially visible right upper lobe  peribronchial opacity most compatible with Acute Bronchopneumonia.  4. Carious dentition.  Study discussed by telephone with Dr. Delton Prairie on 06/16/2022 at 06:13 .  Electronically Signed   By: Odessa Fleming M.D.   On: 06/16/2022 06:16 CT CEREBRAL PERFUSION W CONTRAST CLINICAL DATA:  67 year old male with unilateral weakness and facial asymmetry since 20/1 100 hours.  EXAM: CT PERFUSION BRAIN  TECHNIQUE: Multiphase CT imaging of the brain was performed following IV bolus contrast injection. Subsequent parametric perfusion maps were calculated using RAPID software.  RADIATION DOSE REDUCTION: This exam was performed according to the departmental dose-optimization program which includes automated exposure control, adjustment of the mA and/or kV according to patient size and/or use of iterative reconstruction technique.  CONTRAST:  OMNIPAQUE IOHEXOL 350 MG/ML SOLN  COMPARISON:  Plain head CT 0540 hours today. CTA head and neck reported separately.  FINDINGS: ASPECTS: 10  CBF (<30%) Volume: 0mL  Perfusion (Tmax>6.0s) volume: 0mL  No CTP parameter abnormality detected.  Mismatch Volume: Not applicable  Infarction Location:Not applicable  IMPRESSION: Negative CT Perfusion.  Electronically Signed   By: Odessa Fleming M.D.   On: 06/16/2022 06:02  Lab Results  Component Value Date   WBC 4.9 06/16/2022   HGB 15.4 06/16/2022   HCT 47.4 06/16/2022   MCV 89.3 06/16/2022   PLT 202 06/16/2022   Last metabolic panel Lab Results  Component Value Date   GLUCOSE 332 (H) 06/16/2022   NA 134 (L) 06/16/2022   K 3.6 06/16/2022   CL 98 06/16/2022   CO2 24 06/16/2022   BUN 13 06/16/2022   CREATININE 0.90 06/16/2022   GFRNONAA >60 06/16/2022   CALCIUM 8.8 (L) 06/16/2022   PROT 7.0 06/16/2022   ALBUMIN 3.8 06/16/2022   BILITOT 0.9 06/16/2022   ALKPHOS 21 (L) 06/16/2022   AST 35 06/16/2022   ALT 47 (H) 06/16/2022   ANIONGAP 12 06/16/2022    Assessment and Plan: * Acute  CVA (cerebrovascular accident) (HCC) Acute right-sided weakness, facial droop and dysarthria since patient waking up this morning Last known normal was around 9 PM yesterday Baseline remote history of tobacco abuse and active alcohol use Code stroke CT head and CT angio of the head and neck within normal limits apart from age indeterminate 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 MRI of the brain pending Teleneurology consulted Appreciate recommendations Status post aspirin and Plavix load DAPT treatment protocol Atorvastatin 40 mg daily Risk stratification labs 2D echo Follow  Hyperglycemia Blood sugars in 300s on presentation Sliding-scale insulin A1c  Hypertension Blood pressure 160s-180s/90s-100s Allow for permissive hypertension in the setting of active CVA As needed IV labetalol for systolic pressure greater than 220 or diastolic pressure greater than 110 Follow  Alcohol abuse Patient reports drinking approximately 12 beers daily Does appear to have some mild withdrawal symptoms in the setting of active CVA with mild diaphoresis and generalized tremor Start CIWA protocol IV thiamine folate and multivitamin Alcohol level within normal  limits Check urine drug screen  Follow  Lung infiltrate on CT Noted Partially visible right upper lobe peribronchial opacity most compatible with Acute Bronchopneumonia on imaging.  No cough, SOB, hypoxia at present Will monitor for now  Reassess if any significant hypoxia, fever, clinical decline Follow closely      Advance Care Planning:   Code Status: Full Code   Consults: Teleneurology   Family Communication: No family at the bedside   Severity of Illness: The appropriate patient status for this patient is OBSERVATION. Observation status is judged to be reasonable and necessary in order to provide the required intensity of service to ensure the patient's safety. The patient's presenting  symptoms, physical exam findings, and initial radiographic and laboratory data in the context of their medical condition is felt to place them at decreased risk for further clinical deterioration. Furthermore, it is anticipated that the patient will be medically stable for discharge from the hospital within 2 midnights of admission.   Author: Floydene Flock, MD 06/16/2022 7:46 AM  For on call review [email or web address].

## 2022-06-16 NOTE — Assessment & Plan Note (Signed)
Blood sugars in 300s on presentation Sliding-scale insulin A1c

## 2022-06-16 NOTE — ED Notes (Signed)
Ordered regular bed for pt as he states he is uncomfortable on stretcher currently.

## 2022-06-16 NOTE — Assessment & Plan Note (Signed)
Blood pressure 160s-180s/90s-100s Allow for permissive hypertension in the setting of active CVA As needed IV labetalol for systolic pressure greater than 220 or diastolic pressure greater than 110 Follow

## 2022-06-16 NOTE — ED Notes (Signed)
UNABLE TO PERFORM SWALLOW SCREEN AT THIS TIME OR GIVE PO MEDS OR PERFORM NIH DUE TO PT SLEEPING AT THIS TIME.

## 2022-06-16 NOTE — ED Notes (Signed)
Messaged provider Alvester Morin to come talk to son.

## 2022-06-16 NOTE — ED Notes (Signed)
1610 Code Stroke cart activated- LKW 2100 last night at bed- pt woke at 4am with R side weakness, slurred speech, and R facial droop. BP 182/100, glucose 314. Pt already in CT when elerted. 66 TS paged 62 Dr Gordy Savers on camera [phone] Pt back from CT  Laurann Montana, RN Telestroke nurse

## 2022-06-16 NOTE — Assessment & Plan Note (Addendum)
Patient reports drinking approximately 12 beers daily Does appear to have some mild withdrawal symptoms in the setting of active CVA with mild diaphoresis and generalized tremor Start CIWA protocol IV thiamine folate and multivitamin Alcohol level within normal limits Check urine drug screen  Follow

## 2022-06-16 NOTE — ED Notes (Signed)
CBG: 209 

## 2022-06-16 NOTE — ED Notes (Signed)
Tried to call MRI to inform pt received ativan.

## 2022-06-16 NOTE — ED Notes (Signed)
Dr Alvester Morin at bedside again, informed him of changes to NIH score.

## 2022-06-16 NOTE — Evaluation (Signed)
Occupational Therapy Evaluation Patient Details Name: Wesley Barry MRN: [id number] DOB: [birth year] Today's Date: 06/16/2022   History of Present Illness Pt is a 67 y.o. male with medical history significant of alcohol use, obesity, remote history of tobacco use, history of shingles presenting with CVA.  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   Clinical Impression   Wesley Barry was seen for OT/PT co-evaluation this date. Prior to hospital admission, pt was IND. Pt lives with girlfriend in mobile home c 3 [address]. Pt presents to acute OT demonstrating impaired ADL performance and functional mobility 2/2 functional impaired use of dominant RUE. Pt currently requires MAX A don B socks seated EOB. MOD A don gown seated EOB. MAX A x2 sup<>sit and sit<>stand at elevated bed height, R knee buckling noted requiring block. Pt would benefit from skilled OT to address noted impairments and functional limitations (see below for any additional details). Upon hospital discharge, recommend follow up therapy <3 hours/day.   Recommendations for follow up therapy are one component of a multi-disciplinary discharge planning process, led by the attending physician.  Recommendations may be updated based on patient status, additional functional criteria and insurance authorization.   Assistance Recommended at Discharge Frequent or constant Supervision/Assistance  Patient can return home with the following Two people to help with walking and/or transfers;Two people to help with bathing/dressing/bathroom;Help with stairs or ramp for entrance    Functional Status Assessment  Patient has had a recent decline in their functional status and demonstrates the ability to make significant improvements in function in a reasonable and predictable amount of time.  Equipment  Recommendations  Other (comment) (defer to next venue of care)    Recommendations for Other Services       Precautions / Restrictions Precautions Precautions: Fall Restrictions Weight Bearing Restrictions: No      Mobility Bed Mobility Overal bed mobility: Needs Assistance Bed Mobility: Supine to Sit, Sit to Supine     Supine to sit: Max assist, +2 for physical assistance Sit to supine: Max assist, +2 for physical assistance   General bed mobility comments: +2 heavy assist for BLE and trunk control    Transfers Overall transfer level: Needs assistance Equipment used: Hemi-walker Transfers: Sit to/from Stand Sit to Stand: Max assist, +2 physical assistance, From elevated surface           General transfer comment: R knee block and heavy assist standing from a significantly elevated surface      Balance Overall balance assessment: Needs assistance Sitting-balance support: Single extremity supported Sitting balance-Leahy Scale: Poor   Postural control: Right lateral lean Standing balance support: Single extremity supported, During functional activity, Reliant on assistive device for balance Standing balance-Leahy Scale: Poor Standing balance comment: Frequent RLE buckling in standing requiring assist/blocking R knee to prevent falls                           ADL either performed or assessed with clinical judgement   ADL Overall ADL's : Needs assistance/impaired                                       General ADL Comments: MAX A don B socks seated EOB. MOD A don gown seated EOB. Anticipate MAX  A x2 for [facility] t/f      Pertinent Vitals/Pain Pain Assessment Pain Assessment: No/denies pain     Hand Dominance     Extremity/Trunk Assessment Upper Extremity Assessment Upper Extremity Assessment: RUE deficits/detail RUE Deficits / Details: 0/5 grossly. pt identifies light touch to R hand as L hand   Lower Extremity Assessment Lower  Extremity Assessment: RLE deficits/detail RLE Deficits / Details: Trace knee ext strength, 2+/5 knee flex, no active DF, 3+/5 PF, sensation to light touch grossly intact but difficult to fully assess secondary to communication deficits       Communication Communication Communication: Expressive difficulties   Cognition Arousal/Alertness: Awake/alert Behavior During Therapy: WFL for tasks assessed/performed Overall Cognitive Status: Difficult to assess                                 General Comments: A&O x4                Home Living Family/patient expects to be discharged to:: Private residence Living Arrangements: Other (Comment) (Girlfriend) Available Help at Discharge: Family;Friend(s) Type of Home: Mobile home Home Access: Stairs to enter Entrance Stairs-Number of Steps: 3 Entrance Stairs-Rails: Right;Left;Can reach both Home Layout: One level                          Prior Functioning/Environment Prior Level of Function : Independent/Modified Independent;Driving             Mobility Comments: Ind, drives ADLs Comments: Ind with ADLs        OT Problem List: Decreased strength;Decreased range of motion;Decreased activity tolerance;Impaired balance (sitting and/or standing);Decreased safety awareness;Impaired UE functional use      OT Treatment/Interventions: Self-care/ADL training;Therapeutic exercise;Energy conservation;DME and/or AE instruction;Therapeutic activities;Balance training;Patient/family education    OT Goals(Current goals can be found in the care plan section) Acute Rehab OT Goals Patient Stated Goal: to return to PLOF OT Goal Formulation: With patient Time For Goal Achievement: 06/30/22 Potential to Achieve Goals: Good ADL Goals Pt Will Perform Eating: with set-up;sitting;with min guard assist;with caregiver independent in assisting Pt Will Perform Upper Body Dressing: sitting;with min assist;with caregiver  independent in assisting Pt Will Transfer to Toilet: with min assist;with +2 assist;bedside commode;stand pivot transfer Pt/caregiver will Perform Home Exercise Program: With minimal assist;Increased ROM;Increased strength;Right Upper extremity  OT Frequency: Min 2X/week    Co-evaluation PT/OT/SLP Co-Evaluation/Treatment: Yes Reason for Co-Treatment: Complexity of the patient's impairments (multi-system involvement);For patient/therapist safety;To address functional/ADL transfers PT goals addressed during session: Mobility/safety with mobility;Proper use of DME OT goals addressed during session: ADL's and self-care      AM-PAC OT "6 Clicks" Daily Activity     Outcome Measure Help from another person eating meals?: A Little Help from another person taking care of personal grooming?: A Little Help from another person toileting, which includes using toliet, bedpan, or urinal?: A Lot Help from another person bathing (including washing, rinsing, drying)?: A Lot Help from another person to put on and taking off regular upper body clothing?: A Lot Help from another person to put on and taking off regular lower body clothing?: A Lot 6 Click Score: 14   End of Session Nurse Communication: Mobility status  Activity Tolerance: Patient tolerated treatment well Patient left: in bed;with call bell/phone within reach;with family/visitor present  OT Visit Diagnosis: Other abnormalities of gait and mobility (R26.89);Muscle weakness (generalized) (M62.81)  Time: 1610-9604 OT Time Calculation (min): 26 min Charges:  OT General Charges $OT Visit: 1 Visit OT Evaluation $OT Eval Moderate Complexity: 1 Mod  Kathie Dike, M.S. OTR/L  06/16/22, 3:55 PM  ascom [phone]

## 2022-06-16 NOTE — ED Notes (Signed)
Dr Alvester Morin on way to talk to pt about MRI. Pt is refusing MRI even when this RN offered versed. Pt in bed.

## 2022-06-16 NOTE — ED Notes (Signed)
Spoke with MRI and informed that pt received ativan and is sleeping and should be able to tolerate MRI now.

## 2022-06-16 NOTE — ED Notes (Signed)
Son apologized for yelling and cursing and is now calm.

## 2022-06-16 NOTE — Consult Note (Signed)
[facility] Tele[facility]urology Consult Services   Patient Name:   Ducharme, Marik Date of Birth:   12/29/1955 Identification Number:   MRN - [id number] Date of Service:   06/16/2022 05:32:13  Diagnosis:       I63.89 - Cerebrovascular accident (CVA) due to other mechanism [facility])  Impression:      This is a 67 year old male who [facility] [place] in the early morning of 06/16/22 by EMS for right facial droop, slurred speech.    He was LKW before bed at 21:00 on 06/15/22. he awoke at ~ 4 AM with the symptoms.  His presentation is consistent with stroke, likely subcorical lacunar. He is outside the window for tPA/TNK. Recommendations below.   Recommendations:        Stroke/Telemetry Floor       Neuro Checks       Bedside Swallow Eval       DVT Prophylaxis       IV Fluids, Normal Saline       Head of Bed 30 Degrees       Euglycemia and Avoid Hyperthermia (PRN Acetaminophen)       Antihypertensives PRN if Blood pressure is greater than 220/120 or there is a concern for End organ damage/contraindications for permissive HTN. If blood pressure is greater than 220/120 give labetalol PO or IV or Vasotec IV with a goal of 15% reduction in BP during the first 24 hours.        Aspirin 325 mg once now, then 81 mg daily.        Also give 300 mg clopidogrel one time, followed by 90 days of clopidogrel 75 mg daily.         While taking clopidogrel, patient should avoid omeprazole or esomeprazole when possible due to possible consequent reduced efficacy of clopidogrel. Other proton pump inhibitors, though, may be acceptable.        Atorvastatin 40 mg daily.        Brain MRI without contrast        TTE in hospital and Holter monitor on discharge.        HbA1C, Lipid Panel, TSH        Lifestyle Modifications: Weight management (goal healthy BMI), smoking cessation if smoker, Mediterranean diet,  exercise (moderate intensive aerobic exercise at least 3 days of the week for 20-60 minutes at a time per [facility] guidelines, or as tolerated), active lifestyle, outpatient goal normotension, outpatient goal LDL <70. PCP to assist with these modifications and goals.  ------------------------------------------------------------------------------  Advanced Imaging: CTA Head and Neck Completed.  CTP Completed.  LVO:No intervenable large vessel occlusion. Left vertebral artery is occluded or not visualized, which is not intervenable given patent distal basilar.   Patient in not a candidate for NIR          Metrics: Last Known Well: 06/15/2022 21:00:00 [facility] Notification Time: 06/16/2022 05:32:13 Arrival Time: 06/16/2022 05:28:00 Stamp Time: 06/16/2022 05:32:13 Initial Response Time: 06/16/2022 05:36:04 Symptoms: right facial droop, slurred speech. Initial patient interaction: 06/16/2022 05:53:00 NIHSS Assessment Completed: 06/16/2022 06:00:00 Patient is not a candidate for Thrombolytic. Thrombolytic Medical Decision: 06/16/2022 06:00:00 Patient was not deemed candidate for Thrombolytic because of following reasons: Last Well Known Above 4.5 Hours.  I personally Reviewed the CT Head and it Showed no hemorrhage. 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 No acute pathology seen.  Primary Provider Notified of Diagnostic Impression and Management Plan on: 06/16/2022 05:41:39    ------------------------------------------------------------------------------  History of Present Illness: Patient is a 67 year old Male.  Patient was brought by EMS for symptoms of right facial droop, slurred speech. This is a 67 year old male who [facility] [place] in the early morning of 06/16/22 by EMS for right facial droop, slurred speech.  He was LKW before bed at 21:00 on 06/15/22. he awoke at ~ 4 AM with the  symptoms.  Of note, he recently saw cardiology for "fluttering" sensation of the heart.   Past Medical History:      Hypertension      Coronary Artery Disease  Medications:  No Anticoagulant use  No Antiplatelet use Reviewed EMR for current medications  Allergies:  Reviewed  Social History: Drug Use: No  Family History:  There is no family history of premature cerebrovascular disease pertinent to this consultation  ROS : 14 Points Review of Systems was performed and was negative except mentioned in HPI.  Past Surgical History: There Is No Surgical History Contributory To Today's Visit    Examination: BP(182/100), Pulse(110), Blood Glucose(314) 1A: Level of Consciousness - Alert; keenly responsive + 0 1B: Ask Month and Age - Both Questions Right + 0 1C: Blink Eyes & Squeeze Hands - Performs Both Tasks + 0 2: Test Horizontal Extraocular Movements - Normal + 0 3: Test Visual Fields - No Visual Loss + 0 4: Test Facial Palsy (Use Grimace if Obtunded) - Minor paralysis (flat nasolabial fold, smile asymmetry) + 1 5A: Test Left Arm Motor Drift - No Drift for 10 Seconds + 0 5B: Test Right Arm Motor Drift - Drift, but doesn't hit bed + 1 6A: Test Left Leg Motor Drift - No Drift for 5 Seconds + 0 6B: Test Right Leg Motor Drift - No Drift for 5 Seconds + 0 7: Test Limb Ataxia (FNF/Heel-Shin) - No Ataxia + 0 8: Test Sensation - Mild-Moderate Loss: Less Sharp/More Dull + 1 9: Test Language/Aphasia - Normal; No aphasia + 0 10: Test Dysarthria - Severe Dysarthria: Unintelligble Slurring or Out of Proportion to Aphasia + 2 11: Test Extinction/Inattention - No abnormality + 0  NIHSS Score: 5   Pre-Morbid Modified Rankin Scale: 0 Points = No symptoms at all  Spoke with : ed provider  Patient/Family was informed the Neurology Consult would occur via TeleHealth consult by way of interactive audio and video telecommunications and consented to receiving care in this  manner.   Patient is being evaluated for possible acute neurologic impairment and high probability of imminent or life-threatening deterioration. I spent total of 40 minutes providing care to this patient, including time for face to face visit via telemedicine, review of medical records, imaging studies and discussion of findings with providers, the patient and/or family.   Dr Flonnie Overman   [facility] For Inpatient follow-up with [facility] physician please call [facility] [phone]. This is not an outpatient service. Post hospital discharge, please contact hospital directly.  Please do not communicate with [facility] physicians via secure chat. If you have any questions, Please contact [facility]. Please call or reconsult our service if there are any clinical or diagnostic changes.

## 2022-06-16 NOTE — ED Notes (Signed)
Pt continues to pull self off cardiac monitor, BP cuff and pulse ox intermittently as states is hot. Pt not excessively diaphoretic or flushed currently. Room temp is slowly decreasing.

## 2022-06-16 NOTE — ED Notes (Signed)
Pt to MRI via stretcher at this time.

## 2022-06-16 NOTE — ED Notes (Signed)
Pt was repositioned in bed. Echocardiogram being performed at bedside.

## 2022-06-16 NOTE — ED Notes (Signed)
Pt's son leaving bedside; son states pt done with his dinner tray. Denies any needs currently.

## 2022-06-16 NOTE — ED Notes (Signed)
Pt to MRI now

## 2022-06-16 NOTE — ED Notes (Signed)
Pt's son at bedside. Pt able to wiggle R foot toes currently; this is improved from NIHSS earlier.

## 2022-06-16 NOTE — ED Notes (Signed)
Placed on 2L oxygen because pt sleeping and snoring with desaturation to 88% on RA.

## 2022-06-16 NOTE — ED Notes (Signed)
Pt switched from stretcher to inpt centrella bed.

## 2022-06-16 NOTE — ED Notes (Signed)
Placed on 1L, SPO2 dropping to 88% on RA.

## 2022-06-16 NOTE — ED Notes (Signed)
Other RN and NT at bedside assisting pt with peri care.

## 2022-06-16 NOTE — Evaluation (Signed)
Physical Therapy Evaluation Patient Details Name: Wesley Barry MRN: [id number] DOB: 02/01/1956 Today's Date: 06/16/2022  History of Present Illness  Pt is a 67 y.o. male with medical history significant of alcohol use, obesity, remote history of tobacco use, history of shingles presenting with CVA.  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   Clinical Impression  Pt presented with significant deficits in expressive communication with son present who assisted with history and PLOF.  Per OT eval and below pt with some strength to his RUE and RLE but not functionally significant at this time.  Pt required extensive +2 physical assistance with bed mobility tasks and to come to standing from an elevated surface with HW support.  Pt made attempts to advance LEs once in standing with cues for sequencing but was unable to advance either LE with R knee quick to buckle with attempts.  Pt will benefit from continued PT services upon discharge to safely address deficits listed in patient problem list for decreased caregiver assistance and eventual return to PLOF.        Recommendations for follow up therapy are one component of a multi-disciplinary discharge planning process, led by the attending physician.  Recommendations may be updated based on patient status, additional functional criteria and insurance authorization.  Follow Up Recommendations Can patient physically be transported by private vehicle: No     Assistance Recommended at Discharge Frequent or constant Supervision/Assistance  Patient can return home with the following  Two people to help with walking and/or transfers;Two people to help with bathing/dressing/bathroom;Assistance with cooking/housework;Direct supervision/assist for medications management;Help with stairs or ramp for  entrance;Assist for transportation    Equipment Recommendations Other (comment) (TBD at next venue of care)  Recommendations for Other Services       Functional Status Assessment Patient has had a recent decline in their functional status and/or demonstrates limited ability to make significant improvements in function in a reasonable and predictable amount of time     Precautions / Restrictions Precautions Precautions: Fall Restrictions Weight Bearing Restrictions: No      Mobility  Bed Mobility Overal bed mobility: Needs Assistance Bed Mobility: Supine to Sit, Sit to Supine     Supine to sit: Max assist, +2 for physical assistance Sit to supine: Max assist, +2 for physical assistance   General bed mobility comments: +2 heavy assist for BLE and trunk control    Transfers Overall transfer level: Needs assistance Equipment used: Hemi-walker Transfers: Sit to/from Stand Sit to Stand: Max assist, +2 physical assistance, From elevated surface           General transfer comment: Mod multi-modal cues for sequencing with R knee blocked and heavy assist to come to standing from a significantly elevated surface    Ambulation/Gait               General Gait Details: Unable to advance either LE  Stairs            Wheelchair Mobility    Modified Rankin (Stroke Patients Only)       Balance Overall balance assessment: Needs assistance Sitting-balance support: Single extremity supported Sitting balance-Leahy Scale: Poor   Postural control: Right lateral lean Standing balance support: Single extremity supported, During functional activity, Reliant on assistive device for balance Standing balance-Leahy Scale: Poor Standing balance comment: Frequent RLE buckling in standing requiring assist/blocking R knee  to prevent falls                             Pertinent Vitals/Pain Pain Assessment Pain Assessment: No/denies pain    Home Living  Family/patient expects to be discharged to:: Private residence Living Arrangements: Other (Comment) (Girlfriend) Available Help at Discharge: Family;Friend(s) Type of Home: Mobile home Home Access: Stairs to enter Entrance Stairs-Rails: Right;Left;Can reach both Entrance Stairs-Number of Steps: 3   Home Layout: One level        Prior Function Prior Level of Function : Independent/Modified Independent             Mobility Comments: Ind amb without an AD, drives ADLs Comments: Ind with ADLs     Hand Dominance        Extremity/Trunk Assessment   Upper Extremity Assessment Upper Extremity Assessment: Defer to OT evaluation    Lower Extremity Assessment Lower Extremity Assessment: RLE deficits/detail RLE Deficits / Details: Trace knee ext strength, 2+/5 knee flex, no active DF, 3+/5 PF, sensation to light touch grossly intact but difficult to fully assess secondary to communication deficits       Communication   Communication: Expressive difficulties  Cognition Arousal/Alertness: Awake/alert Behavior During Therapy: WFL for tasks assessed/performed Overall Cognitive Status: Difficult to assess                                          General Comments      Exercises Other Exercises Other Exercises: L lateral weight shifting in sitting to address R lateral lean   Assessment/Plan    PT Assessment Patient needs continued PT services  PT Problem List Decreased strength;Decreased activity tolerance;Decreased balance;Decreased mobility;Decreased knowledge of use of DME;Decreased safety awareness       PT Treatment Interventions DME instruction;Gait training;Stair training;Functional mobility training;Therapeutic activities;Therapeutic exercise;Balance training;Neuromuscular re-education;Patient/family education    PT Goals (Current goals can be found in the Care Plan section)  Acute Rehab PT Goals PT Goal Formulation: Patient unable to participate  in goal setting Time For Goal Achievement: 06/29/22 Potential to Achieve Goals: Fair    Frequency Min 4X/week     Co-evaluation PT/OT/SLP Co-Evaluation/Treatment: Yes Reason for Co-Treatment: Complexity of the patient's impairments (multi-system involvement);For patient/therapist safety;To address functional/ADL transfers PT goals addressed during session: Mobility/safety with mobility;Proper use of DME         AM-PAC PT "6 Clicks" Mobility  Outcome Measure Help needed turning from your back to your side while in a flat bed without using bedrails?: Total Help needed moving from lying on your back to sitting on the side of a flat bed without using bedrails?: Total Help needed moving to and from a bed to a chair (including a wheelchair)?: Total Help needed standing up from a chair using your arms (e.g., wheelchair or bedside chair)?: Total Help needed to walk in hospital room?: Total Help needed climbing 3-5 steps with a railing? : Total 6 Click Score: 6    End of Session Equipment Utilized During Treatment: Gait belt Activity Tolerance: Patient tolerated treatment well Patient left: in bed;with call bell/phone within reach;with family/visitor present Nurse Communication: Mobility status PT Visit Diagnosis: Unsteadiness on feet (R26.81);Difficulty in walking, not elsewhere classified (R26.2);Muscle weakness (generalized) (M62.81);Hemiplegia and hemiparesis Hemiplegia - Right/Left: Right    Time: 1347-1410 PT Time Calculation (min) (ACUTE ONLY): 23 min  Charges:   PT Evaluation $PT Eval Moderate Complexity: 1 Mod        D. Scott Seleste Tallman PT, DPT 06/16/22, 3:06 PM

## 2022-06-16 NOTE — Progress Notes (Signed)
*  PRELIMINARY RESULTS* Echocardiogram 2D Echocardiogram has been performed.  Cristela Blue 06/16/2022, 1:18 PM

## 2022-06-16 NOTE — Plan of Care (Signed)
Notified of worsening NIH post Ativan with increased right-sided weakness. Has a lacunar infarct which is notorious for waxing and waning.  Noncon head CT to rule out hemorrhagic transformation or acute process. Noncontrast head CT-no hemorrhagic transformation.  No acute change.  Continue recommendations in the initial consult.  Will follow-up  -- Milon Dikes, MD Neurologist [facility] Pager: [phone]

## 2022-06-16 NOTE — ED Triage Notes (Signed)
Pt arrived via [facility] as a CODE STROKE that was activated in field. LKW, 06/15/22 at 2100 when pt went to bed. Symptoms discovered upon awakening at 0400 this morning. Symptoms present: slurred speech, right sided facial droop and right sided weakness. No known blood thinners listed in pts chart. Pt taken directly to CT with tele-neuro activated.

## 2022-06-16 NOTE — ED Provider Notes (Signed)
[facility] Provider Note    Event Date/Time   First MD Initiated Contact with Patient 06/16/22 0530     (approximate)   History   Code Stroke   HPI  Wesley Barry is a 67 y.o. male who presents to the ED for evaluation of Code Stroke   I review a cardiology clinic visit from 1 year ago.  Obese patient with 50-pack-year smoke history.  Patient presents to the ED for evaluation of right-sided weakness.  Reportedly at his baseline when he went to bed at 9 PM last night and awoke with the symptoms.  No falls, syncope or recent illnesses.  No fevers.  Code stroke activated prehospital  Physical Exam   Triage Vital Signs: ED Triage Vitals  Enc Vitals Group     BP      Pulse      Resp      Temp      Temp src      SpO2      Weight      Height      Head Circumference      Peak Flow      Pain Score      Pain Loc      Pain Edu?      Excl. in GC?     Most recent vital signs: Vitals:   06/16/22 0610  BP: (!) 188/106  Pulse: (!) 102  Resp: (!) 98  Temp: 98.2 F (36.8 C)  SpO2: 95%    General: Awake, no distress.  CV:  Good peripheral perfusion.  Resp:  Normal effort.  Abd:  No distention.  MSK:  No deformity noted.  Neuro:  Right-sided weakness and right-sided facial asymmetry is noted.  Some mild dysarthria is present but he is maintaining his airway. Other:     ED Results / Procedures / Treatments   Labs (all labs ordered are listed, but only abnormal results are displayed) Labs Reviewed  COMPREHENSIVE METABOLIC PANEL - Abnormal; Notable for the following components:      Result Value   Sodium 134 (*)    Glucose, Bld 332 (*)    Calcium 8.8 (*)    ALT 47 (*)    Alkaline Phosphatase 21 (*)    All other components within normal limits  CBG MONITORING, ED - Abnormal; Notable for the following components:   Glucose-Capillary 314 (*)    All other components within normal limits  PROTIME-INR  APTT  CBC  DIFFERENTIAL   ETHANOL    EKG Sinus tachycardia the rate of 106 bpm.  Leftward axis.  Normal intervals.  No STEMI.  Nonspecific ST changes are noted.  RADIOLOGY CT head interpreted by me without evidence of acute intracranial pathology  Official radiology report(s): CT HEAD CODE STROKE WO CONTRAST  Addendum Date: 06/16/2022   ADDENDUM REPORT: 06/16/2022 06:17 ADDENDUM: Study discussed by telephone with Dr. Delton Prairie on 06/16/2022 0547 hours today. Electronically Signed   By: Odessa Fleming M.D.   On: 06/16/2022 06:17   Result Date: 06/16/2022 CLINICAL DATA:  Code stroke. 67 year old male last known well 2100 hours. EXAM: CT HEAD WITHOUT CONTRAST TECHNIQUE: Contiguous axial images were obtained from the base of the skull through the vertex without intravenous contrast. RADIATION DOSE REDUCTION: This exam was performed according to the departmental dose-optimization program which includes automated exposure control, adjustment of the mA and/or kV according to patient size and/or use of iterative reconstruction technique. COMPARISON:  None Available. FINDINGS:  Brain: Cerebral volume is within normal limits for age. No midline shift, mass effect, or evidence of intracranial mass lesion. No ventriculomegaly. No acute intracranial hemorrhage identified. Patchy mild bilateral cerebral white matter hypodensity. Perivascular space left inferior lentiform (normal variant). But mild heterogeneity also in the bilateral thalami and brainstem. No cortically based acute infarct identified. Vascular: Extensive Calcified atherosclerosis at the skull base. No suspicious intracranial vascular hyperdensity. Skull: No acute osseous abnormality identified. Sinuses/Orbits: Mild paranasal sinus mucosal thickening, primarily in the maxillary sinuses. Tympanic cavities and mastoids are clear. Other: No gaze deviation, negative orbit and scalp soft tissues. ASPECTS [facility] Stroke Program Early CT Score) Total score (0-10 with 10 being normal): 10  IMPRESSION: 1. No acute cortically based infarct or acute intracranial hemorrhage identified. ASPECTS 10. 2. But evidence of some age indeterminate small vessel disease in both hemispheres. Electronically Signed: By: Odessa Fleming M.D. On: 06/16/2022 05:43   CT ANGIO HEAD NECK W WO CM (CODE STROKE)  Result Date: 06/16/2022 CLINICAL DATA:  67 year old male with unilateral weakness, facial asymmetry since 2100 hours. EXAM: CT ANGIOGRAPHY HEAD AND NECK TECHNIQUE: Multidetector CT imaging of the head and neck was performed using the standard protocol during bolus administration of intravenous contrast. Multiplanar CT image reconstructions and MIPs were obtained to evaluate the vascular anatomy. Carotid stenosis measurements (when applicable) are obtained utilizing NASCET criteria, using the distal internal carotid diameter as the denominator. RADIATION DOSE REDUCTION: This exam was performed according to the departmental dose-optimization program which includes automated exposure control, adjustment of the mA and/or kV according to patient size and/or use of iterative reconstruction technique. CONTRAST:  OMNIPAQUE IOHEXOL 350 MG/ML SOLN COMPARISON:  Plain head CT today 0540 hours. CT Perfusion at the same time reported separately. FINDINGS: CTA NECK Skeleton: Carious dentition. Cervical spine degeneration with degenerative C2-C3 facet ankylosis on the left. No acute osseous abnormality identified. Upper chest: Confluent but sub solid right upper lobe peribronchial opacity on both series 6, images 169 and 172. This has an infectious/inflammatory appearance. Visible left lung appears negative. Mediastinal lipomatosis. Visible central pulmonary arteries appear patent. No mediastinal lymphadenopathy. Other neck: No acute finding. Aortic arch: 3 vessel arch.  Mild Calcified aortic atherosclerosis. Right carotid system: Mildly tortuous brachiocephalic artery and right CCA origin with mild plaque and no significant  stenosis. More moderate soft and calcified plaque of the right CCA at the level of the larynx (series 6, image 134) but less than 50 % stenosis with respect to the distal vessel before the right carotid bifurcation. At the bifurcation there is calcified atherosclerosis of the right ICA origin and bulb but less than 50 % stenosis with respect to the distal vessel. Right ICA remains patent to the skull base. Left carotid system: Mildly tortuous left CCA with only mild plaque before the carotid bifurcation. Calcified left ICA origin and bulb with less than 50 % stenosis with respect to the distal vessel. Vertebral arteries: Normal proximal right subclavian artery. Faint calcified plaque at the right vertebral artery origin without stenosis. Dominant appearing right vertebral artery is patent to the skull base with no additional plaque or stenosis. Proximal left subclavian artery mild tortuosity and plaque without stenosis. Normal left vertebral artery origin on series 8, image 283. Non dominant left vertebral artery is mildly irregular throughout the neck but patent to the skull base with no significant stenosis identified. CTA HEAD Posterior circulation: Dominant right vertebral artery is patent and functionally supplies the basilar. Right PICA origin is normal. Right V4  calcified plaque distal to the PICA is prominent but results in less than 50 % stenosis with respect to the distal vessel. Contralateral non dominant left vertebral artery is occluded in the distal V4 segment, in an area of calcified plaque (series 9, image 118). Left PICA origin remains patent. Basilar artery is patent with mild proximal 3rd basilar irregularity and mild stenosis. Basilar tip, SCA and PCA origins are patent. Posterior communicating arteries are diminutive or absent. Mild to moderate irregularity and stenosis of the left PCA P2 segment on series 13 image 25. And similar right PCA proximal P2 segment appearance on image 17. Bilateral  distal PCA branches remain patent. Anterior circulation: Both ICA siphons are patent. Left siphon calcified plaque is moderate to severe in the supraclinoid segment with associated moderate to severe supraclinoid stenosis on series 9, image 84. Similar contralateral right ICA supraclinoid segment predominant calcified plaque, moderate to severe supraclinoid stenosis on series 9, image 83. But there is also conspicuous right petrous ICA soft and calcified plaque resulting in mild stenosis. Patent carotid termini, MCA and ACA origins. Mildly tortuous A1 segments. Normal anterior communicating artery. Bilateral ACA branches are within normal limits. Left MCA M1 segment is patent with mild to moderate irregularity and stenosis on series 11, image 21. Patent left MCA bifurcation. No left MCA branch occlusion is identified but there is up to moderate multifocal M2 and M3 branch irregularity and stenosis on series 13, image 29. Contralateral right MCA M1 is patent with mild irregularity and stenosis. Patent right MCA bifurcation without stenosis. No right MCA branch occlusion identified, and comparatively mild right MCA branch irregularity. Venous sinuses: Early contrast timing, not evaluated. Anatomic variants: Dominant right vertebral artery. Review of the MIP images confirms the above findings IMPRESSION: 1. Negative for large vessel occlusion. 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 - other fairly Extensive Intracranial Atherosclerosis with - Severe bilateral supraclinoid ICA stenoses. - up to Moderate Left MCA M1 stenosis, bilateral PCA P2 segment stenoses. - and left > right mild to moderate MCA branch irregularity and stenosis. 2. Bilateral carotid bifurcation and right vertebral V4 segment calcified atherosclerosis without hemodynamically significant  stenosis. Aortic Atherosclerosis (ICD10-I70.0). 3. Partially visible right upper lobe peribronchial opacity most compatible with Acute Bronchopneumonia. 4. Carious dentition. Study discussed by telephone with Dr. Delton Prairie on 06/16/2022 at 06:13 . Electronically Signed   By: Odessa Fleming M.D.   On: 06/16/2022 06:16   CT CEREBRAL PERFUSION W CONTRAST  Result Date: 06/16/2022 CLINICAL DATA:  67 year old male with unilateral weakness and facial asymmetry since 20/1 100 hours. EXAM: CT PERFUSION BRAIN TECHNIQUE: Multiphase CT imaging of the brain was performed following IV bolus contrast injection. Subsequent parametric perfusion maps were calculated using RAPID software. RADIATION DOSE REDUCTION: This exam was performed according to the departmental dose-optimization program which includes automated exposure control, adjustment of the mA and/or kV according to patient size and/or use of iterative reconstruction technique. CONTRAST:  OMNIPAQUE IOHEXOL 350 MG/ML SOLN COMPARISON:  Plain head CT 0540 hours today. CTA head and neck reported separately. FINDINGS: ASPECTS: 10 CBF (<30%) Volume: 0mL Perfusion (Tmax>6.0s) volume: 0mL No CTP parameter abnormality detected. Mismatch Volume: Not applicable Infarction Location:Not applicable IMPRESSION: Negative CT Perfusion. Electronically Signed   By: Odessa Fleming M.D.   On: 06/16/2022 06:02    PROCEDURES and INTERVENTIONS:  .1-3 Lead EKG Interpretation  Performed by: Delton Prairie, MD Authorized by: Delton Prairie, MD     Interpretation: abnormal     ECG rate:  104   ECG rate assessment: tachycardic     Rhythm: sinus tachycardia     Ectopy: none     Conduction: normal   .Critical Care  Performed by: Delton Prairie, MD Authorized by: Delton Prairie, MD   Critical care provider statement:    Critical care time (minutes):  30   Critical care time was exclusive of:  Separately billable procedures and treating other patients   Critical care was necessary to treat or  prevent imminent or life-threatening deterioration of the following conditions:  CNS failure or compromise   Critical care was time spent personally by me on the following activities:  Development of treatment plan with patient or surrogate, discussions with consultants, evaluation of patient's response to treatment, examination of patient, ordering and review of laboratory studies, ordering and review of radiographic studies, ordering and performing treatments and interventions, pulse oximetry, re-evaluation of patient's condition and review of old charts   Medications  midazolam (VERSED) injection 2 mg (has no administration in time range)  iohexol (OMNIPAQUE) 350 MG/ML injection 100 mL (100 mLs Intravenous Contrast Given 06/16/22 0543)  aspirin chewable tablet 324 mg (324 mg Oral Given 06/16/22 0640)  clopidogrel (PLAVIX) tablet 300 mg (300 mg Oral Given 06/16/22 0640)     IMPRESSION / MDM / ASSESSMENT AND PLAN / ED COURSE  I reviewed the triage vital signs and the nursing notes.  Differential diagnosis includes, but is not limited to, stroke, ICH, seizure, metabolic encephalopathy  {Patient presents with symptoms of an acute illness or injury that is potentially life-threatening.  Lifelong smoker presents with right-sided weakness outside of any TNK window with evidence of acute stroke requiring medical admission.  No signs of LVO on angiograms.  Hyperglycemia on metabolic panel without DKA.  Normal CBC.  No signs of particular toxidromes or ingestions.  Consulted with neurology and hospitalist who agrees to admit.  Clinical Course as of 06/16/22 0713  Tue Jun 16, 2022  1914 Call from rads regarding CT head. No hemorrhage or anything obvious [DS]  0600 Reassessed.  Being evaluated by neurology [DS]  [phone] Another call from rads regarding contrasted studies. Extensive atherosclerosis. 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 Normal perfusion.  Recommend MRI [DS]  0623 I consult with  neurology who recommends aspirin and Plavix and MRI. [DS]    Clinical Course User Index [DS] Delton Prairie, MD     FINAL CLINICAL IMPRESSION(S) / ED DIAGNOSES   Final diagnoses:  Right sided weakness     Rx / DC Orders   ED Discharge Orders     None        Note:  This document was prepared using Dragon voice recognition software and may include unintentional dictation errors.   Delton Prairie, MD 06/16/22 [phone]

## 2022-06-16 NOTE — ED Notes (Signed)
Pt's son states his dad drinks around 18 beers a day.

## 2022-06-16 NOTE — ED Notes (Signed)
Patient incontinent of urine in bed. Peri care performed. Linens changed and clean dry brief applied. Positioned for comfort. Denies additional needs.

## 2022-06-16 NOTE — ED Notes (Signed)
Pt's family denies pt being on CPAP at home. State pt doesn't go to see PCP.

## 2022-06-16 NOTE — ED Notes (Signed)
Pt awake now and NIH can be performed.

## 2022-06-16 NOTE — Assessment & Plan Note (Signed)
Noted Partially visible right upper lobe peribronchial opacity most compatible with Acute Bronchopneumonia on imaging.  No cough, SOB, hypoxia at present Will monitor for now  Reassess if any significant hypoxia, fever, clinical decline Follow closely

## 2022-06-16 NOTE — ED Notes (Signed)
Abrasion noted to pt's R knee.

## 2022-06-16 NOTE — ED Notes (Signed)
PT at bedside.

## 2022-06-16 NOTE — ED Notes (Signed)
Pt in good spirits; pt joking with this RN and son during and after NIHSS.

## 2022-06-16 NOTE — ED Notes (Signed)
Pt denies any needs currently; pt's son given recliner, blankets and pillow as is planning to stay overnight to assist with pt.

## 2022-06-17 ENCOUNTER — Encounter: Payer: Self-pay | Admitting: Internal Medicine

## 2022-06-17 ENCOUNTER — Other Ambulatory Visit: Payer: Self-pay

## 2022-06-17 DIAGNOSIS — I6389 Other cerebral infarction: Secondary | ICD-10-CM | POA: Diagnosis not present

## 2022-06-17 DIAGNOSIS — I5022 Chronic systolic (congestive) heart failure: Secondary | ICD-10-CM | POA: Diagnosis not present

## 2022-06-17 DIAGNOSIS — I639 Cerebral infarction, unspecified: Secondary | ICD-10-CM | POA: Diagnosis not present

## 2022-06-17 LAB — LIPID PANEL
Cholesterol: 289 mg/dL — ABNORMAL HIGH (ref 0–200)
HDL: 38 mg/dL — ABNORMAL LOW (ref >40)
LDL Cholesterol: 220 mg/dL — ABNORMAL HIGH (ref 0–99)
Total CHOL/HDL Ratio: 7.6 RATIO
Triglycerides: 153 mg/dL — ABNORMAL HIGH (ref <150)
VLDL: 31 mg/dL (ref 0–40)

## 2022-06-17 LAB — CBG MONITORING, ED
Glucose-Capillary: 174 mg/dL — ABNORMAL HIGH (ref 70–99)
Glucose-Capillary: 203 mg/dL — ABNORMAL HIGH (ref 70–99)
Glucose-Capillary: 239 mg/dL — ABNORMAL HIGH (ref 70–99)
Glucose-Capillary: 242 mg/dL — ABNORMAL HIGH (ref 70–99)

## 2022-06-17 LAB — BASIC METABOLIC PANEL
Anion gap: 11 (ref 5–15)
BUN: 13 mg/dL (ref 8–23)
CO2: 25 mmol/L (ref 22–32)
Calcium: 9 mg/dL (ref 8.9–10.3)
Chloride: 100 mmol/L (ref 98–111)
Creatinine, Ser: 0.81 mg/dL (ref 0.61–1.24)
GFR, Estimated: >60 mL/min (ref >60)
Glucose, Bld: 251 mg/dL — ABNORMAL HIGH (ref 70–99)
Potassium: 3.9 mmol/L (ref 3.5–5.1)
Sodium: 136 mmol/L (ref 135–145)

## 2022-06-17 LAB — MAGNESIUM: Magnesium: 2.2 mg/dL (ref 1.7–2.4)

## 2022-06-17 LAB — GLUCOSE, CAPILLARY
Glucose-Capillary: 195 mg/dL — ABNORMAL HIGH (ref 70–99)
Glucose-Capillary: 229 mg/dL — ABNORMAL HIGH (ref 70–99)
Glucose-Capillary: 266 mg/dL — ABNORMAL HIGH (ref 70–99)

## 2022-06-17 LAB — PHOSPHORUS: Phosphorus: 4.2 mg/dL (ref 2.5–4.6)

## 2022-06-17 LAB — TSH: TSH: 1.557 u[IU]/mL (ref 0.350–4.500)

## 2022-06-17 MED ORDER — ENOXAPARIN SODIUM 40 MG/0.4ML IJ SOSY
40.0000 mg | PREFILLED_SYRINGE | Freq: Every evening | INTRAMUSCULAR | Status: DC
  Administered 2022-06-17 – 2022-06-18 (×2): 40 mg via SUBCUTANEOUS
  Filled 2022-06-17 (×2): qty 0.4

## 2022-06-17 MED ORDER — METOPROLOL SUCCINATE ER 25 MG PO TB24
25.0000 mg | ORAL_TABLET | Freq: Every day | ORAL | Status: DC
  Administered 2022-06-17: 25 mg via ORAL
  Filled 2022-06-17: qty 1

## 2022-06-17 MED ORDER — ATORVASTATIN CALCIUM 80 MG PO TABS
80.0000 mg | ORAL_TABLET | Freq: Every day | ORAL | Status: DC
  Administered 2022-06-17 – 2022-06-19 (×3): 80 mg via ORAL
  Filled 2022-06-17 (×3): qty 1

## 2022-06-17 MED ORDER — LISINOPRIL 5 MG PO TABS
5.0000 mg | ORAL_TABLET | Freq: Every day | ORAL | Status: DC
  Administered 2022-06-17: 5 mg via ORAL
  Filled 2022-06-17: qty 1

## 2022-06-17 MED ORDER — INSULIN GLARGINE-YFGN 100 UNIT/ML ~~LOC~~ SOLN
10.0000 [IU] | Freq: Every day | SUBCUTANEOUS | Status: DC
  Administered 2022-06-17 – 2022-06-19 (×3): 10 [IU] via SUBCUTANEOUS
  Filled 2022-06-17 (×3): qty 0.1

## 2022-06-17 NOTE — Consult Note (Signed)
Cardiology Consultation   Patient ID: [name] MRN: [id number]; DOB: 05-08-1955  Admit date: 06/16/2022 Date of Consult: 06/17/2022  PCP:  Patient, No Pcp Per   [facility] Providers Cardiologist:  Wesley Nordmann, MD        Patient Profile:   Wesley Barry is a 67 y.o. male with a hx of significant for prior smoker 50-pack-year history, obesity, shingles, alcohol use who is being seen 06/17/2022 for the evaluation of a newfound cardiomyopathy on echocardiogram at the request of Dr. Lucianne Barry.  History of Present Illness:   Wesley Barry was last seen in clinic 06/2021 by Dr. Mariah Barry.  At that time he was seen as a self-referral for shortness of breath and fluttering in his chest.  He denied any recent chest discomfort but was concerning and a strong family history of coronary artery disease with his father having coronary artery disease.  At that time he had had fluttering stating a couple months prior he woke up and his heart was pounding and had had no further episodes since that time.  Had not been taking any current medications at the time of this visit and was scheduled for coronary CTA which unfortunately he did not have.  He presented to the [facility] emergency department on 06/16/2022 as a code stroke.  He was reportedly at his baseline when he went to bed at 9 PM the night before and awoke with the symptoms at 4 AM of slurred speech, right-sided facial droop, and right-sided weakness.  He reports no chest pain or shortness of breath, no palpitations or a history of an irregular heart rhythm.  Denies any prior history of stroke symptoms in the past.  Remote history of tobacco use in the past. Per the patient stating that he does drink around 12 beers daily but denies any liquor intake or recreational/illicit drug use.  States he has not seen a doctor on a regular basis.  Unfortunately he was out of the window for IV TNK.  He was evaluated by neurology with a blood work and  immediate scanning completed on arrival.  Family has remained at the bedside  Initial vital signs: Blood pressure 188/106, pulse 102, temperature of 98.2  Pertinent labs: Sodium 134, blood glucose of 332, calcium 8.8, ALT 47, alkaline phos 21  Imaging: CT of the head was without evidence of acute intracranial pathology; CTA of the head and neck was negative for any large vessel occlusion but does show age-indeterminate occlusion of the nondominant atherosclerotic left vertebral artery  Since administered in the emergency department: Plavix 300 mg, aspirin 324 mg  Cardiology was consulted this morning for cardiomyopathy found on on echocardiogram. Past Medical History:  Diagnosis Date   History of shingles 2021    Past Surgical History:  Procedure Laterality Date   HERNIA REPAIR       Home Medications:  Prior to Admission medications   Not on File    Inpatient Medications: Scheduled Meds:   stroke: early stages of recovery book   Does not apply Once   aspirin EC  81 mg Oral Daily   atorvastatin  80 mg Oral QHS   clopidogrel  75 mg Oral Daily   folic acid  1 mg Oral Daily   insulin aspart  0-15 Units Subcutaneous Q4H   lisinopril  5 mg Oral Daily   metoprolol succinate  25 mg Oral Daily   midazolam  2 mg Intravenous Once   multivitamin with minerals  1  tablet Oral Daily   thiamine  100 mg Oral Daily   Or   thiamine  100 mg Intravenous Daily   Continuous Infusions:  PRN Meds: labetalol, LORazepam **OR** LORazepam  Allergies:    Allergies  Allergen Reactions   Tylenol With Codeine #3 [Acetaminophen-Codeine]     Social History:   Social History   Socioeconomic History   Marital status: Married    Spouse name: Not on file   Number of children: Not on file   Years of education: Not on file   Highest education level: Not on file  Occupational History   Not on file  Tobacco Use   Smoking status: Former    Packs/day: 2.00    Years: 25.00    Additional pack  years: 0.00    Total pack years: 50.00    Types: Cigarettes    Quit date: 2004    Years since quitting: 20.3   Smokeless tobacco: Not on file  Substance and Sexual Activity   Alcohol use: Yes    Alcohol/week: 18.0 standard drinks of alcohol    Types: 18 Cans of beer per week   Drug use: Never   Sexual activity: Not on file  Other Topics Concern   Not on file  Social History Narrative   Not on file   Social Determinants of Health   Financial Resource Strain: Not on file  Food Insecurity: No Food Insecurity (06/17/2022)   Hunger Vital Sign    Worried About Running Out of Food in the Last Year: Never true    Ran Out of Food in the Last Year: Never true  Transportation Needs: No Transportation Needs (06/17/2022)   PRAPARE - Administrator, Civil Service (Medical): No    Lack of Transportation (Non-Medical): No  Physical Activity: Not on file  Stress: Not on file  Social Connections: Not on file  Intimate Partner Violence: Not At Risk (06/17/2022)   Humiliation, Afraid, Rape, and Kick questionnaire    Fear of Current or Ex-Partner: No    Emotionally Abused: No    Physically Abused: No    Sexually Abused: No    Family History:    Family History  Problem Relation Age of Onset   Heart attack Mother 51   Heart attack Father 48     ROS:  Please see the history of present illness.  Review of Systems  Constitutional:  Positive for malaise/fatigue.  Neurological:  Positive for focal weakness.    All other ROS reviewed and negative.     Physical Exam/Data:   Vitals:   06/17/22 0718 06/17/22 1010 06/17/22 1100 06/17/22 1310  BP: (!) 149/87  (!) 156/69   Pulse: 79 78 78 (!) 105  Resp: 20  20   Temp: 97.8 F (36.6 C)  97.8 F (36.6 C)   TempSrc: Oral     SpO2: 94%  94% 96%  Weight:      Height:       No intake or output data in the 24 hours ending 06/17/22 1322    06/16/2022    6:10 AM 06/11/2021    9:51 AM 06/11/2021    9:40 AM  Last 3 Weights  Weight  (lbs) 260 lb 258 lb 258 lb  Weight (kg) 117.935 kg 117.028 kg 117.028 kg     Body mass index is 38.4 kg/m.  General:  In no acute distress HEENT: normal, continued facial droop on the right Neck: no JVD Vascular: No carotid bruits;  Distal pulses 2+ bilaterally Cardiac:  normal S1, S2; RRR; no murmur  Lungs:  clear to auscultation bilaterally, no wheezing, rhonchi or rales, respirations are unlabored at rest on 2 L of O2 via nasal cannula Abd: soft, nontender, obese, no hepatomegaly  Ext: no edema Musculoskeletal:  No deformities, BUE and BLE strength normal and equal Skin: warm and dry  Neuro: Patient continues to have deficits to the right side with flaccid right arm and decreased motion of right leg weakness and continued slurred speech and dysarthria Psych:  Normal affect   EKG:  The EKG was personally reviewed and demonstrates:  sinus tach 106, LAD, ST depression in lateral leads Telemetry:  Telemetry was personally reviewed and demonstrates: Sinus rhythm to sinus tach heart rates of 80-100  Relevant CV Studies: TTE 06/16/22 1. Left ventricular ejection fraction, by estimation, is 40 to 45%. The  left ventricle has mildly decreased function. The left ventricle  demonstrates global hypokinesis. There is severe left ventricular  hypertrophy. Left ventricular diastolic parameters   are consistent with Grade I diastolic dysfunction (impaired relaxation).   2. Right ventricular systolic function is normal. The right ventricular  size is normal.   3. The mitral valve is grossly normal. No evidence of mitral valve  regurgitation. No evidence of mitral stenosis.   4. The aortic valve was not well visualized. Aortic valve regurgitation  is mild. No aortic stenosis is present.   Laboratory Data:  High Sensitivity Troponin:  No results for input(s): "TROPONINIHS" in the last 720 hours.   Chemistry Recent Labs  Lab 06/16/22 0605 06/17/22 0712  NA 134* 136  K 3.6 3.9  CL 98 100   CO2 24 25  GLUCOSE 332* 251*  BUN 13 13  CREATININE 0.90 0.81  CALCIUM 8.8* 9.0  MG  --  2.2  GFRNONAA >60 >60  ANIONGAP 12 11    Recent Labs  Lab 06/16/22 0605  PROT 7.0  ALBUMIN 3.8  AST 35  ALT 47*  ALKPHOS 21*  BILITOT 0.9   Lipids  Recent Labs  Lab 06/17/22 0712  CHOL 289*  TRIG 153*  HDL 38*  LDLCALC 220*  CHOLHDL 7.6    Hematology Recent Labs  Lab 06/16/22 0605  WBC 4.9  RBC 5.31  HGB 15.4  HCT 47.4  MCV 89.3  MCH 29.0  MCHC 32.5  RDW 12.2  PLT 202   Thyroid No results for input(s): "TSH", "FREET4" in the last 168 hours.  BNPNo results for input(s): "BNP", "PROBNP" in the last 168 hours.  DDimer No results for input(s): "DDIMER" in the last 168 hours.   Radiology/Studies:  ECHOCARDIOGRAM COMPLETE  Result Date: 06/16/2022    ECHOCARDIOGRAM REPORT   Patient Name:   [facility] Date of Exam: 06/16/2022 Medical Rec #:  [id number]         Height:       69.0 in Accession #:    [id number]        Weight:       260.0 lb Date of Birth:  1955-09-24          BSA:          2.310 m Patient Age:    67 years          BP:           156/112 mmHg Patient Gender: M                 HR:  88 bpm. Exam Location:  [facility] Procedure: 2D Echo, Cardiac Doppler and Color Doppler Indications:     TIA G 45.9  History:         Patient has no prior history of Echocardiogram examinations.                  Risk Factors:Hypertension. Alcohol abuse , CVA.  Sonographer:     Cristela Blue Referring Phys:  [phone] Francoise Schaumann [place] Diagnosing Phys: Yvonne Kendall MD  Sonographer Comments: Suboptimal apical window and no subcostal window. IMPRESSIONS  1. Left ventricular ejection fraction, by estimation, is 40 to 45%. The left ventricle has mildly decreased function. The left ventricle demonstrates global hypokinesis. There is severe left ventricular hypertrophy. Left ventricular diastolic parameters  are consistent with Grade I diastolic dysfunction (impaired relaxation).  2. Right ventricular  systolic function is normal. The right ventricular size is normal.  3. The mitral valve is grossly normal. No evidence of mitral valve regurgitation. No evidence of mitral stenosis.  4. The aortic valve was not well visualized. Aortic valve regurgitation is mild. No aortic stenosis is present. FINDINGS  Left Ventricle: Left ventricular ejection fraction, by estimation, is 40 to 45%. The left ventricle has mildly decreased function. The left ventricle demonstrates global hypokinesis. The left ventricular internal cavity size was normal in size. There is  severe left ventricular hypertrophy. Left ventricular diastolic parameters are consistent with Grade I diastolic dysfunction (impaired relaxation). Right Ventricle: The right ventricular size is normal. Right vetricular wall thickness was not well visualized. Right ventricular systolic function is normal. Left Atrium: Left atrial size was normal in size. Right Atrium: Right atrial size was normal in size. Pericardium: The pericardium was not well visualized. Presence of epicardial fat layer. Mitral Valve: The mitral valve is grossly normal. No evidence of mitral valve regurgitation. No evidence of mitral valve stenosis. MV peak gradient, 2.3 mmHg. The mean mitral valve gradient is 1.0 mmHg. Tricuspid Valve: The tricuspid valve is not well visualized. Tricuspid valve regurgitation is trivial. Aortic Valve: The aortic valve was not well visualized. Aortic valve regurgitation is mild. No aortic stenosis is present. Aortic valve mean gradient measures 2.5 mmHg. Aortic valve peak gradient measures 3.8 mmHg. Aortic valve area, by VTI measures 5.31  cm. Pulmonic Valve: The pulmonic valve was not well visualized. Pulmonic valve regurgitation is not visualized. No evidence of pulmonic stenosis. Aorta: The aortic root is normal in size and structure. Pulmonary Artery: The pulmonary artery is of normal size. IAS/Shunts: The interatrial septum was not well visualized.  LEFT  VENTRICLE PLAX 2D LVIDd:         3.30 cm   Diastology LVIDs:         2.55 cm   LV e' medial:    4.24 cm/s LV PW:         1.60 cm   LV E/e' medial:  10.5 LV IVS:        1.70 cm   LV e' lateral:   6.53 cm/s LVOT diam:     2.30 cm   LV E/e' lateral: 6.8 LV SV:         70 LV SV Index:   30 LVOT Area:     4.15 cm  RIGHT VENTRICLE RV Basal diam:  3.00 cm RV Mid diam:    3.00 cm RV S prime:     11.20 cm/s TAPSE (M-mode): 3.0 cm LEFT ATRIUM  Index        RIGHT ATRIUM           Index LA diam:        2.65 cm 1.15 cm/m   RA Area:     15.90 cm LA Vol (A2C):   40.6 ml 17.58 ml/m  RA Volume:   40.90 ml  17.71 ml/m LA Vol (A4C):   19.8 ml 8.57 ml/m LA Biplane Vol: 30.9 ml 13.38 ml/m  AORTIC VALVE AV Area (Vmax):    4.09 cm AV Area (Vmean):   3.73 cm AV Area (VTI):     5.31 cm AV Vmax:           97.30 cm/s AV Vmean:          70.950 cm/s AV VTI:            0.132 m AV Peak Grad:      3.8 mmHg AV Mean Grad:      2.5 mmHg LVOT Vmax:         95.80 cm/s LVOT Vmean:        63.700 cm/s LVOT VTI:          0.168 m LVOT/AV VTI ratio: 1.28  AORTA Ao Root diam: 3.60 cm MITRAL VALVE               TRICUSPID VALVE MV Area (PHT): 4.36 cm    TR Peak grad:   11.8 mmHg MV Area VTI:   3.94 cm    TR Vmax:        172.00 cm/s MV Peak grad:  2.3 mmHg MV Mean grad:  1.0 mmHg    SHUNTS MV Vmax:       0.76 m/s    Systemic VTI:  0.17 m MV Vmean:      49.8 cm/s   Systemic Diam: 2.30 cm MV Decel Time: 174 msec MV E velocity: 44.50 cm/s MV A velocity: 71.80 cm/s MV E/A ratio:  0.62 Cristal Deer End MD Electronically signed by Yvonne Kendall MD Signature Date/Time: 06/16/2022/6:34:52 PM    Final    CT HEAD WO CONTRAST ( )  Result Date: 06/16/2022 CLINICAL DATA:  Stroke, follow up EXAM: CT HEAD WITHOUT CONTRAST TECHNIQUE: Contiguous axial images were obtained from the base of the skull through the vertex without intravenous contrast. RADIATION DOSE REDUCTION: This exam was performed according to the departmental dose-optimization program  which includes automated exposure control, adjustment of the mA and/or kV according to patient size and/or use of iterative reconstruction technique. COMPARISON:  CT 06/16/2022 at 0541 hours, MRI 06/16/2022 FINDINGS: Brain: Subtle low-density changes in the left basal ganglia compatible with acute infarct as seen on recent MRI. No hemorrhagic transformation. No evidence of new large territory infarction. No hydrocephalus. No mass lesion or mass effect. Vascular: Atherosclerotic calcifications involving the large vessels of the skull base. No unexpected hyperdense vessel. Skull: Normal. Negative for fracture or focal lesion. Sinuses/Orbits: No acute finding. Other: None. IMPRESSION: Subtle low-density changes in the left basal ganglia compatible with acute infarct as seen on recent MRI. No hemorrhagic transformation. Electronically Signed   By: Duanne Guess D.O.   On: 06/16/2022 10:24   MR BRAIN WO CONTRAST  Result Date: 06/16/2022 CLINICAL DATA:  right sided weakness. EXAM: MRI HEAD WITHOUT CONTRAST TECHNIQUE: Multiplanar, multiecho pulse sequences of the brain and surrounding structures were obtained without intravenous contrast. COMPARISON:  CT head from the same day. 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 Slight edema without mass  effect. No evidence of acute hemorrhage, mass lesion, midline shift or hydrocephalus. Scattered T2/FLAIR hyperintensities in the white matter, nonspecific but compatible with chronic microvascular ischemic changes. Vascular: Major arterial flow voids are maintained at the skull base. Skull and upper cervical spine: Normal marrow signal. Sinuses/Orbits: Largely clear sinuses.  No acute orbital findings. Other: No mastoid effusions. 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 Electronically Signed   By: Feliberto Harts M.D.   On: 06/16/2022 09:24   CT HEAD CODE STROKE WO CONTRAST  Addendum Date: 06/16/2022   ADDENDUM REPORT:  06/16/2022 06:17 ADDENDUM: Study discussed by telephone with Dr. Delton Prairie on 06/16/2022 0547 hours today. Electronically Signed   By: Odessa Fleming M.D.   On: 06/16/2022 06:17   Result Date: 06/16/2022 CLINICAL DATA:  Code stroke. 67 year old male last known well 2100 hours. EXAM: CT HEAD WITHOUT CONTRAST TECHNIQUE: Contiguous axial images were obtained from the base of the skull through the vertex without intravenous contrast. RADIATION DOSE REDUCTION: This exam was performed according to the departmental dose-optimization program which includes automated exposure control, adjustment of the mA and/or kV according to patient size and/or use of iterative reconstruction technique. COMPARISON:  None Available. FINDINGS: Brain: Cerebral volume is within normal limits for age. No midline shift, mass effect, or evidence of intracranial mass lesion. No ventriculomegaly. No acute intracranial hemorrhage identified. Patchy mild bilateral cerebral white matter hypodensity. Perivascular space left inferior lentiform (normal variant). But mild heterogeneity also in the bilateral thalami and brainstem. No cortically based acute infarct identified. Vascular: Extensive Calcified atherosclerosis at the skull base. No suspicious intracranial vascular hyperdensity. Skull: No acute osseous abnormality identified. Sinuses/Orbits: Mild paranasal sinus mucosal thickening, primarily in the maxillary sinuses. Tympanic cavities and mastoids are clear. Other: No gaze deviation, negative orbit and scalp soft tissues. ASPECTS [facility] Stroke Program Early CT Score) Total score (0-10 with 10 being normal): 10 IMPRESSION: 1. No acute cortically based infarct or acute intracranial hemorrhage identified. ASPECTS 10. 2. But evidence of some age indeterminate small vessel disease in both hemispheres. Electronically Signed: By: Odessa Fleming M.D. On: 06/16/2022 05:43   CT ANGIO HEAD NECK W WO CM (CODE STROKE)  Result Date: 06/16/2022 CLINICAL DATA:   67 year old male with unilateral weakness, facial asymmetry since 2100 hours. EXAM: CT ANGIOGRAPHY HEAD AND NECK TECHNIQUE: Multidetector CT imaging of the head and neck was performed using the standard protocol during bolus administration of intravenous contrast. Multiplanar CT image reconstructions and MIPs were obtained to evaluate the vascular anatomy. Carotid stenosis measurements (when applicable) are obtained utilizing NASCET criteria, using the distal internal carotid diameter as the denominator. RADIATION DOSE REDUCTION: This exam was performed according to the departmental dose-optimization program which includes automated exposure control, adjustment of the mA and/or kV according to patient size and/or use of iterative reconstruction technique. CONTRAST:  OMNIPAQUE IOHEXOL 350 MG/ML SOLN COMPARISON:  Plain head CT today 0540 hours. CT Perfusion at the same time reported separately. FINDINGS: CTA NECK Skeleton: Carious dentition. Cervical spine degeneration with degenerative C2-C3 facet ankylosis on the left. No acute osseous abnormality identified. Upper chest: Confluent but sub solid right upper lobe peribronchial opacity on both series 6, images 169 and 172. This has an infectious/inflammatory appearance. Visible left lung appears negative. Mediastinal lipomatosis. Visible central pulmonary arteries appear patent. No mediastinal lymphadenopathy. Other neck: No acute finding. Aortic arch: 3 vessel arch.  Mild Calcified aortic atherosclerosis. Right carotid system: Mildly tortuous brachiocephalic artery and right CCA origin with mild plaque  and no significant stenosis. More moderate soft and calcified plaque of the right CCA at the level of the larynx (series 6, image 134) but less than 50 % stenosis with respect to the distal vessel before the right carotid bifurcation. At the bifurcation there is calcified atherosclerosis of the right ICA origin and bulb but less than 50 % stenosis with respect to  the distal vessel. Right ICA remains patent to the skull base. Left carotid system: Mildly tortuous left CCA with only mild plaque before the carotid bifurcation. Calcified left ICA origin and bulb with less than 50 % stenosis with respect to the distal vessel. Vertebral arteries: Normal proximal right subclavian artery. Faint calcified plaque at the right vertebral artery origin without stenosis. Dominant appearing right vertebral artery is patent to the skull base with no additional plaque or stenosis. Proximal left subclavian artery mild tortuosity and plaque without stenosis. Normal left vertebral artery origin on series 8, image 283. Non dominant left vertebral artery is mildly irregular throughout the neck but patent to the skull base with no significant stenosis identified. CTA HEAD Posterior circulation: Dominant right vertebral artery is patent and functionally supplies the basilar. Right PICA origin is normal. Right V4 calcified plaque distal to the PICA is prominent but results in less than 50 % stenosis with respect to the distal vessel. Contralateral non dominant left vertebral artery is occluded in the distal V4 segment, in an area of calcified plaque (series 9, image 118). Left PICA origin remains patent. Basilar artery is patent with mild proximal 3rd basilar irregularity and mild stenosis. Basilar tip, SCA and PCA origins are patent. Posterior communicating arteries are diminutive or absent. Mild to moderate irregularity and stenosis of the left PCA P2 segment on series 13 image 25. And similar right PCA proximal P2 segment appearance on image 17. Bilateral distal PCA branches remain patent. Anterior circulation: Both ICA siphons are patent. Left siphon calcified plaque is moderate to severe in the supraclinoid segment with associated moderate to severe supraclinoid stenosis on series 9, image 84. Similar contralateral right ICA supraclinoid segment predominant calcified plaque, moderate to severe  supraclinoid stenosis on series 9, image 83. But there is also conspicuous right petrous ICA soft and calcified plaque resulting in mild stenosis. Patent carotid termini, MCA and ACA origins. Mildly tortuous A1 segments. Normal anterior communicating artery. Bilateral ACA branches are within normal limits. Left MCA M1 segment is patent with mild to moderate irregularity and stenosis on series 11, image 21. Patent left MCA bifurcation. No left MCA branch occlusion is identified but there is up to moderate multifocal M2 and M3 branch irregularity and stenosis on series 13, image 29. Contralateral right MCA M1 is patent with mild irregularity and stenosis. Patent right MCA bifurcation without stenosis. No right MCA branch occlusion identified, and comparatively mild right MCA branch irregularity. Venous sinuses: Early contrast timing, not evaluated. Anatomic variants: Dominant right vertebral artery. Review of the MIP images confirms the above findings IMPRESSION: 1. Negative for large vessel occlusion. 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 - other fairly Extensive Intracranial Atherosclerosis with - Severe bilateral supraclinoid ICA stenoses. - up to Moderate Left MCA M1 stenosis, bilateral PCA P2 segment stenoses. - and left > right mild to moderate MCA branch irregularity and stenosis. 2. Bilateral carotid bifurcation and right vertebral V4 segment calcified atherosclerosis without hemodynamically significant stenosis.  Aortic Atherosclerosis (ICD10-I70.0). 3. Partially visible right upper lobe peribronchial opacity most compatible with Acute Bronchopneumonia. 4. Carious dentition. Study discussed by telephone with Dr. Delton Prairie on 06/16/2022 at 06:13 . Electronically Signed   By: Odessa Fleming M.D.   On: 06/16/2022 06:16   CT CEREBRAL PERFUSION W  CONTRAST  Result Date: 06/16/2022 CLINICAL DATA:  67 year old male with unilateral weakness and facial asymmetry since 20/1 100 hours. EXAM: CT PERFUSION BRAIN TECHNIQUE: Multiphase CT imaging of the brain was performed following IV bolus contrast injection. Subsequent parametric perfusion maps were calculated using RAPID software. RADIATION DOSE REDUCTION: This exam was performed according to the departmental dose-optimization program which includes automated exposure control, adjustment of the mA and/or kV according to patient size and/or use of iterative reconstruction technique. CONTRAST:  OMNIPAQUE IOHEXOL 350 MG/ML SOLN COMPARISON:  Plain head CT 0540 hours today. CTA head and neck reported separately. FINDINGS: ASPECTS: 10 CBF (<30%) Volume: 0mL Perfusion (Tmax>6.0s) volume: 0mL No CTP parameter abnormality detected. Mismatch Volume: Not applicable Infarction Location:Not applicable IMPRESSION: Negative CT Perfusion. Electronically Signed   By: Odessa Fleming M.D.   On: 06/16/2022 06:02     Assessment and Plan:   Acute CVA -Acute left basal ganglia infarct -He was outside of the window for IV TNK -Multiple risk factors including uncontrolled diabetes and hyperlipidemia -Continue on DAPT of Plavix and aspirin for 3 months followed by aspirin only -Continue on high intensity statin, atorvastatin 80 mg daily -Allow for permissive hypertension in the setting of CVA -Continues to have right-sided deficits with slurred speech and face droop -Will continue to need therapy likely SNF placement -Continue on telemetry monitoring -Management per IM and neurology  Cardiomyopathy found on echocardiogram -LVEF found to be 40-45%, global hypokinesis, G1 DD, no evidence of valvular abnormalities -Patient with a longstanding history of alcohol abuse likely contributing to his decreased function -Continue to escalate GDMT as tolerated -Continued on lisinopril 5 mg daily, Toprol-XL 25 mg daily -Appears to  be euvolemic on exam now -Will need ischemic workup completed, will likely need to be outpatient and at least 4 weeks given acute CVA  Hypertension -Blood pressure 156/69 -Allowed for permissive hypertension in the setting of CVA, deemed OK to treat from neurology today with blood pressure goal 140/90 or below -As needed labetalol for systolic blood pressure greater than 220 or diastolic pressure greater than 110 -Started on oral antihypertensives today at low-dose -Vital signs per unit protocol  Hyperlipidemia -LDL 220 -Continued on atorvastatin 80 mg daily -Will need repeat hepatic and lipid panel in 8 to 10 weeks  Uncontrolled type 2 diabetes -Hemoglobin A1c of 11.5 -Continued on insulin -Management per IM  Alcohol abuse -Patient reported drinks approximately 12 beers daily -Was started on CIWA protocol -Alcohol level was within normal limits -Continued on thiamine, folate, and multivitamin -Management per IM   Risk Assessment/Risk Scores:                For questions or updates, please contact [facility] Please consult [email or web address] for contact info under    Signed, Teion Ballin, NP  06/17/2022 1:22 PM

## 2022-06-17 NOTE — Inpatient Diabetes Management (Addendum)
Inpatient Diabetes Program Recommendations  AACE/ADA: New Consensus Statement on Inpatient Glycemic Control (2015)  Target Ranges:  Prepandial:   less than 140 mg/dL      Peak postprandial:   less than 180 mg/dL (1-2 hours)      Critically ill patients:  140 - 180 mg/dL    Latest Reference Range & Units 06/16/22 09:45  Hemoglobin A1C 4.8 - 5.6 % 11.5 (H)  283 mg/dl  (H): Data is abnormally high  Latest Reference Range & Units 06/16/22 05:28 06/16/22 08:04 06/16/22 11:59 06/16/22 16:56 06/16/22 20:21 06/17/22 00:41 06/17/22 05:24  Glucose-Capillary 70 - 99 mg/dL 161 (H) 096 (H)  11 units Novolog  233 (H)  5 units Novolog  201 (H)  5 units Novolog  209 (H)  5 units Novolog  174 (H)  Novolog NOT given 203 (H)  Novolog NOT given  (H): Data is abnormally high   Admit with: CVA  New Diagnosis Diabetes  Hx of ETOH Abuse  Current Orders: Novolog Moderate Correction Scale/ SSI (0-15 units) Q4 hours     MD- Please address new diagnosis of Diabetes with pt and family and then then Diabetes RN can begin basic education with pt and family  Do you anticipate pt will need insulin for home given A1c of 11.5%?  May consider adding Semglee 10 units Daily (0.1 units/kg)   Addendum 11am--Met w/ pt and his son and daughter down in the ED.  Pt was somewhat awake during my visit but son and daughter were both very engaged.  Daughter is a Charity fundraiser and lives in [place], son lives near pt and is willing to be involved in care.  Spoke with pt and family about new diagnosis (majority of conversation focused with children as pt was sleepy during my visit).  Discussed A1C results with them and explained what an A1C is, basic pathophysiology of DM Type 2, basic home care, basic diabetes diet nutrition principles, importance of checking CBGs and maintaining good CBG control to prevent long-term and short-term complications.  Reviewed signs and symptoms of hyperglycemia and hypoglycemia and how to  treat hypoglycemia at home.  Also reviewed blood sugar goals and A1c goals for home.  Daughter is nurse and has good understanding of diabetes.  Daughter told me pt has low literacy and will need very basic information.  Discussed with Dtr and son that I am unsure what meds pt will need when he is discharged (insulin or oral meds or both).  Discussed with Dtr and son that if pt goes home on insulin, he may qualify for the Freestyle Libre CGM for home glucose monitoring--Dtr expressed interest in this.  I discussed with Dtr and son how CGM differs from traditional CBG meter and how pt will need basic traditional meter at home as well to check CBGs when CGM reading does not match pt symptoms.    RNs to provide ongoing basic DM education at bedside with this patient and family when appropriate.  Will order educational booklet for pt once he has a room assignment on the floor.  Have also placed RD consult for DM diet education for this patient.  Placed OP DM Edu referral for pt to follow up with after discharge (unsure of pt will go home after d/c or to Rehab first).       --Will follow patient during hospitalization--  Ambrose Finland RN, MSN, CDCES Diabetes Coordinator Inpatient Glycemic Control Team Team Pager: [phone] (8a-5p)

## 2022-06-17 NOTE — Progress Notes (Signed)
[facility] Progress Note  Patient: Wesley Barry    YQM:[id number]  DOA: 06/16/2022     Date of Service: the patient was seen and examined on 06/17/2022  Chief Complaint  Patient presents with   Code Stroke   Brief hospital course: Wesley Barry is a 67 y.o. male with medical history significant of alcohol use, obesity, remote history of tobacco use, history of shingles presenting with CVA.  Patient presented to the ER around 5 AM with noted new onset right-sided weakness.  Patient went to bed around 9 PM with no deficits.    Assessment and Plan:  Acute ischemic CVA Patient presented with right-sided facial droop and right-sided weakness Last known normal 9 PM on 06/16/2022, out of the window for TNK Code stroke CT head and CT angio of the head and neck within normal limits apart from age indeterminate 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 MRI Brain: Acute perforator infarct in the left basal ganglia.  TTE LVEF 40 to 45%, systolic function decreased and global hypokinesia.  Grade 1 diastolic dysfunction.  No significant valvular abnormality, negative PFO Continue to monitor on telemetry, continue fall precaution aspirin precautions and turn patient every 12 hourly SLP eval done, recommended dysphagia 1 diet with nectar thick liquids. Follow PT and OT eval Neurology recommended DAPT for 90 days followed by aspirin only.  30-day cardiac monitor and cardiology consult for low LVEF. Started aspirin 81 mg p.o. daily, Plavix 75 mg p.o. daily, Lipitor 80 mg p.o. daily LDL 220, goal <70, hemoglobin A1c 11.5, elevated. Follow TSH level  Acute systolic CHF Currently patient is well compensated, euvolemic TTE shows LVEF 40 to 45%, LV global hypokinesis and grade 1 diastolic dysfunction It could be secondary to EtOH abuse and stress induced cardiomyopathy Started lisinopril 5 mg daily and Toprol-XL 25 mg p.o. daily Fluid restriction 1.5  L/day, monitor intake and output and daily body weight   New onset diabetes mellitus type 2 Hemoglobin A1c 11.5, uncontrolled diabetes Patient was not taking any medication.  Counseled and advised to use insulin until A1c is controlled and then patient can be switched to oral medications. Started Semglee 10 units nightly, NovoLog sliding scale, monitor CBG, continue diabetic diet. Consulted diabetic coordinator.  EtOH use disorder, abstinence counseling done. Continue CIWA protocol, monitor for withdrawal symptoms Continue multivitamin, thiamine and folic acid.   Body mass index is 38.4 kg/m.  Interventions:     Diet: Dysphagia 1 with nectar thick liquids DVT Prophylaxis: Subcutaneous Lovenox   Advance goals of care discussion: Full code  Family Communication: family was present at bedside, at the time of interview.  The pt provided permission to discuss medical plan with the family. Opportunity was given to ask question and all questions were answered satisfactorily.   Disposition:  Pt is from Home, admitted with CVA, still has right-sided weakness, which precludes a safe discharge. Discharge to SNF, when bed available..  Subjective: Events overnight, patient was admitted due to acute CVA, still has right-sided facial droop and right-sided weakness.  Denies any chest pain or palpitation, no shortness of breath, no any other active issues.  Physical Exam: General: NAD, lying comfortably Appear in no distress, affect appropriate Eyes: PERRLA ENT: Oral Mucosa Clear, moist  Neck: no JVD,  Cardiovascular: S1 and S2 Present, no Murmur,  Respiratory: good respiratory effort, Bilateral Air entry equal and Decreased, no Crackles, no wheezes Abdomen: Bowel Sound present, Soft and no tenderness,  Skin: no rashes Extremities:  no Pedal edema, no calf tenderness Neurologic: Right facial droop and right hemiparesis, power 1-2/5, weakness RUE > RLL Gait not checked due to patient safety  concerns  Vitals:   06/17/22 0718 06/17/22 1010 06/17/22 1100 06/17/22 1310  BP: (!) 149/87  (!) 156/69   Pulse: 79 78 78 (!) 105  Resp: 20  20   Temp: 97.8 F (36.6 C)  97.8 F (36.6 C)   TempSrc: Oral     SpO2: 94%  94% 96%  Weight:      Height:       No intake or output data in the 24 hours ending 06/17/22 1449 Filed Weights   06/16/22 0610  Weight: 117.9 kg    Data Reviewed: I have personally reviewed and interpreted daily labs, tele strips, imagings as discussed above. I reviewed all nursing notes, pharmacy notes, vitals, pertinent old records I have discussed plan of care as described above with RN and patient/family.  CBC: Recent Labs  Lab 06/16/22 0605  WBC 4.9  NEUTROABS 2.3  HGB 15.4  HCT 47.4  MCV 89.3  PLT 202   Basic Metabolic Panel: Recent Labs  Lab 06/16/22 0605 06/17/22 0712  NA 134* 136  K 3.6 3.9  CL 98 100  CO2 24 25  GLUCOSE 332* 251*  BUN 13 13  CREATININE 0.90 0.81  CALCIUM 8.8* 9.0  MG  --  2.2  PHOS  --  4.2    Studies: No results found.  Scheduled Meds:   stroke: early stages of recovery book   Does not apply Once   aspirin EC  81 mg Oral Daily   atorvastatin  80 mg Oral QHS   clopidogrel  75 mg Oral Daily   folic acid  1 mg Oral Daily   insulin aspart  0-15 Units Subcutaneous Q4H   lisinopril  5 mg Oral Daily   metoprolol succinate  25 mg Oral Daily   midazolam  2 mg Intravenous Once   multivitamin with minerals  1 tablet Oral Daily   thiamine  100 mg Oral Daily   Or   thiamine  100 mg Intravenous Daily   Continuous Infusions: PRN Meds: labetalol, LORazepam **OR** LORazepam  Time spent: 55 minutes  Author: Gillis Santa. MD [facility] 06/17/2022 2:49 PM  To reach On-call, see care teams to locate the attending and reach out to them via [email or web address]. If 7PM-7AM, please contact night-coverage If you still have difficulty reaching the attending provider, please page the [facility] (Director on Call) for [facility] on amion for assistance.

## 2022-06-17 NOTE — Progress Notes (Signed)
Neurology Progress Note   S:// Seen and examined From presentation, right upper extremity strength is worsened some but the right leg strength showed some improvement according to the patient and family. Patient has not seen a physician in many years and does not take any medications. His A1c was extremely elevated but he has never been checked for diabetes and did not know that he is likely diabetic  O:// Current vital signs: BP (!) 149/87 (BP Location: Left Arm)   Pulse 79   Temp 97.8 F (36.6 C) (Oral)   Resp 20   Ht 5\' 9"  (1.753 m)   Wt 117.9 kg   SpO2 94%   BMI 38.40 kg/m  Vital signs in last 24 hours: Temp:  [97.8 F (36.6 C)-98.3 F (36.8 C)] 97.8 F (36.6 C) (05/15 0718) Pulse Rate:  [62-99] 79 (05/15 0718) Resp:  [11-27] 20 (05/15 0718) BP: (127-175)/(62-112) 149/87 (05/15 0718) SpO2:  [88 %-100 %] 94 % (05/15 0718) General: Awake alert in no distress HEENT: Normocephalic atraumatic Lungs: Clear Cardiovascular: Regular rhythm Abdomen nondistended nontender Neurological exam Awake alert oriented x 3 Speech is severely dysarthric No evidence of aphasia Cranial nerve examination: Pupils are equal round react light, extraocular movements are unhindered, there is mild right ptosis and weakness of eyelid closure as well as significant right lower facial weakness on exam.  Sensation to face is intact bilaterally.  Auditory acuity is mildly diminished bilaterally, tongue and palate are midline. Motor examination with flaccid right upper extremity with no proximal or distal movement except for some movement of the right shoulder-essentially 0/5.  Right lower extremity is 4 -/5.  Left upper and lower extremity are full strength. Sensation is intact light touch without extinction Coordination on the left is intact.  Unable to check on the right upper extremity.  Difficult to assess on the right lower extremity due to weakness as well. NIHSS 1a Level of Conscious.: 0 1b LOC  Questions: 0 1c LOC Commands: 0 2 Best Gaze: 0 3 Visual: 0 4 Facial Palsy: 2 5a Motor Arm - left: 0 5b Motor Arm - Right: 4 6a Motor Leg - Left: 0 6b Motor Leg - Right: 1 7 Limb Ataxia: 0 8 Sensory: 0 9 Best Language: 0 10 Dysarthria: 2 11 Extinct. and Inatten.: 0 TOTAL: 9   Medications  Current Facility-Administered Medications:     stroke: early stages of recovery book, , Does not apply, Once, Floydene Flock, MD   aspirin EC tablet 81 mg, 81 mg, Oral, Daily, Floydene Flock, MD   atorvastatin (LIPITOR) tablet 40 mg, 40 mg, Oral, Daily, Floydene Flock, MD   clopidogrel (PLAVIX) tablet 75 mg, 75 mg, Oral, Daily, Floydene Flock, MD   folic acid (FOLVITE) tablet 1 mg, 1 mg, Oral, Daily, Floydene Flock, MD   insulin aspart (novoLOG) injection 0-15 Units, 0-15 Units, Subcutaneous, Q4H, Floydene Flock, MD, 5 Units at 06/17/22 8295   labetalol (NORMODYNE) injection 10 mg, 10 mg, Intravenous, Q2H PRN, Floydene Flock, MD   LORazepam (ATIVAN) tablet 1-4 mg, 1-4 mg, Oral, Q1H PRN **OR** LORazepam (ATIVAN) injection 1-4 mg, 1-4 mg, Intravenous, Q1H PRN, Floydene Flock, MD, 1 mg at 06/16/22 2206   midazolam (VERSED) injection 2 mg, 2 mg, Intravenous, Once, Delton [place], MD   multivitamin with minerals tablet 1 tablet, 1 tablet, Oral, Daily, Floydene Flock, MD   thiamine (VITAMIN B1) tablet 100 mg, 100 mg, Oral, Daily **OR** thiamine (VITAMIN B1) injection 100  mg, 100 mg, Intravenous, Daily, Floydene Flock, MD, 100 mg at 06/16/22 1208 No current outpatient medications on file. Labs CBC    Component Value Date/Time   WBC 4.9 06/16/2022 0605   RBC 5.31 06/16/2022 0605   HGB 15.4 06/16/2022 0605   HGB 16.2 11/05/2011 0901   HCT 47.4 06/16/2022 0605   HCT 48.8 11/05/2011 0901   PLT 202 06/16/2022 0605   PLT 263 11/05/2011 0901   MCV 89.3 06/16/2022 0605   MCV 90 11/05/2011 0901   MCH 29.0 06/16/2022 0605   MCHC 32.5 06/16/2022 0605   RDW 12.2 06/16/2022 0605   RDW  12.9 11/05/2011 0901   LYMPHSABS 1.9 06/16/2022 0605   LYMPHSABS 2.2 11/05/2011 0901   MONOABS 0.5 06/16/2022 0605   MONOABS 0.8 11/05/2011 0901   EOSABS 0.1 06/16/2022 0605   EOSABS 0.3 11/05/2011 0901   BASOSABS 0.0 06/16/2022 0605   BASOSABS 0.1 11/05/2011 0901    CMP     Component Value Date/Time   NA 134 (L) 06/16/2022 0605   NA 140 11/05/2011 0901   K 3.6 06/16/2022 0605   K 4.1 11/05/2011 0901   CL 98 06/16/2022 0605   CL 102 11/05/2011 0901   CO2 24 06/16/2022 0605   CO2 25 11/05/2011 0901   GLUCOSE 332 (H) 06/16/2022 0605   GLUCOSE 150 (H) 11/05/2011 0901   BUN 13 06/16/2022 0605   BUN 21 (H) 11/05/2011 0901   CREATININE 0.90 06/16/2022 0605   CREATININE 1.06 11/05/2011 0901   CALCIUM 8.8 (L) 06/16/2022 0605   CALCIUM 9.1 11/05/2011 0901   PROT 7.0 06/16/2022 0605   PROT 7.9 11/05/2011 0901   ALBUMIN 3.8 06/16/2022 0605   ALBUMIN 4.1 11/05/2011 0901   AST 35 06/16/2022 0605   AST 29 11/05/2011 0901   ALT 47 (H) 06/16/2022 0605   ALT 57 11/05/2011 0901   ALKPHOS 21 (L) 06/16/2022 0605   ALKPHOS 38 (L) 11/05/2011 0901   BILITOT 0.9 06/16/2022 0605   BILITOT 0.6 11/05/2011 0901   GFRNONAA >60 06/16/2022 0605   GFRNONAA >60 11/05/2011 0901   GFRAA >60 11/05/2011 0901   LDL 220 A1c 11.5  Lipid Panel     Component Value Date/Time   CHOL 289 (H) 06/17/2022 0712   TRIG 153 (H) 06/17/2022 0712   HDL 38 (L) 06/17/2022 0712   CHOLHDL 7.6 06/17/2022 0712   VLDL 31 06/17/2022 0712   LDLCALC 220 (H) 06/17/2022 0712   2D echo LVEF 40 to 45% with global hypokinesis, grade 1 diastolic dysfunction, left atrium normal size, mitral valve grossly normal.  Aortic valve not well-visualized.  Imaging I have reviewed images in epic and the results pertinent to this consultation are: CT-scan of the brain-NAICP  CTA head and neck and CT perfusion study IMPRESSION: 1. Negative for large vessel occlusion. But Positive for: - age indeterminate Occlusion of the  non-dominant atherosclerotic Left Vertebral Artery V4 segment. Legrand Rams this is chronic based on plain Head CT and CT Perfusion (reported separately). - other fairly Extensive Intracranial Atherosclerosis with - Severe bilateral supraclinoid ICA stenoses. - up to Moderate Left MCA M1 stenosis, bilateral PCA P2 segment stenoses. - and left > right mild to moderate MCA branch irregularity and stenosis 2. Bilateral carotid bifurcation and right vertebral V4 segment calcified atherosclerosis without hemodynamically significant stenosis. Aortic Atherosclerosis (ICD10-I70.0) 3. Partially visible right upper lobe peribronchial opacity most compatible with Acute Bronchopneumonia. 4. Carious dentition.  No CT perfusion deficit  MRI examination of the  brain Acute perforator infarct in the left basal ganglia.   Assessment: 67 year old man presented to the ER early morning of 06/16/2022 outside the window for IV TNKase with right facial droop and slurred speech, on the MRI noted to have a left basal ganglia infarct. Vessel imaging shows severe intracranial atherosclerosis in nearly all vascular territories. Has multiple risk factors including uncontrolled diabetes and hyperlipidemia-which he was unaware of since he has not seen an MD in years. 2D echocardiogram also reveals depressed ejection fraction for which she needs further cardiac workup  Etiology of the stroke likely small vessel disease but needs full workup completed including a cardiology evaluation due to depressed ejection fraction  Impression: Acute left basal ganglia infarct-likely small vessel etiology with risk factors of uncontrolled diabetes and hyperlipidemia as well as intracranial atherosclerosis. Severe intracranial atherosclerosis Hyperlipidemia Uncontrolled diabetes Systolic heart failure   Recommendations: Continue telemetry and frequent neurochecks while in the hospital. Given depressed ejection fraction, I would  recommend consultation with cardiology.  This was relayed early morning to the primary hospitalist. From a stroke prevention standpoint, continue DAPT plus high intensity statin for 3 months followed by aspirin only. A1c severely elevated.  Medical management per primary team for goal A1c of less than 7 No need for permissive hypertension anymore-start normalizing blood pressures with goal blood pressure discharge of 140/90 or below Continue therapy assessments-May need inpatient rehab prior to discharge home Outpatient neurology follow-up-[facility] stroke clinic NP in 8 to 12 weeks or at stroke clinic of his choice as desired by the patient/family. Follow-up with PCP in 2 to 4 weeks after discharge for management of hyperlipidemia and diabetes amongst other comorbidities and to get close follow-up.  Formal plan was relayed via secure chat to Dr. Lucianne Muss, hospitalist  Plan was also gust with patient's son and daughter at bedside.  Images were also shown on the computer.  -- Milon Dikes, MD Neurologist [facility] Pager: [phone]

## 2022-06-17 NOTE — TOC Initial Note (Signed)
Transition of Care [facility]) - Initial/Assessment Note    Patient Details  Name: Wesley Barry MRN: [id number] Date of Birth: 1955-08-19  [facility] CM/SW Contact:    Darolyn Rua, LCSW Phone Number: 06/17/2022, 12:26 PM  Clinical Narrative:                  CSW spoke with patient's son Jomarie Longs regarding PT recommendations of SNF, he is in agreement for referrals to be sent out for bed offers.   CSW has sent out referrals, pending bed offers at this time.   Expected Discharge Plan: Skilled Nursing Facility Barriers to Discharge: Continued Medical Work up   Patient Goals and CMS Choice Patient states their goals for this hospitalization and ongoing recovery are:: to go home CMS Medicare.gov Compare Post Acute Care list provided to:: Patient Represenative (must comment) (son) Choice offered to / list presented to : Adult Children      Expected Discharge Plan and Services       Living arrangements for the past 2 months: Single Family Home                                      Prior Living Arrangements/Services Living arrangements for the past 2 months: Single Family Home Lives with:: Domestic Partner                   Activities of Daily Living Home Assistive Devices/Equipment: None ADL Screening (condition at time of admission) Patient's cognitive ability adequate to safely complete daily activities?: Yes Is the patient deaf or have difficulty hearing?: No Does the patient have difficulty seeing, even when wearing glasses/contacts?: No Does the patient have difficulty concentrating, remembering, or making decisions?: No Patient able to express need for assistance with ADLs?: Yes Does the patient have difficulty dressing or bathing?: No Independently performs ADLs?: No Communication: Independent Dressing (OT): Needs assistance Is this a change from baseline?: Change from baseline, expected to last >3 days Grooming: Independent Feeding:  Independent Bathing: Needs assistance Is this a change from baseline?: Change from baseline, expected to last >3 days Toileting: Needs assistance Is this a change from baseline?: Change from baseline, expected to last >3days In/Out Bed: Needs assistance Is this a change from baseline?: Change from baseline, expected to last >3 days Walks in Home: Needs assistance Is this a change from baseline?: Change from baseline, expected to last >3 days Does the patient have difficulty walking or climbing stairs?: Yes Weakness of Legs: Both Weakness of Arms/Hands: Right  Permission Sought/Granted                  Emotional Assessment       Orientation: : Oriented to Self, Oriented to Place      Admission diagnosis:  Acute CVA (cerebrovascular accident) [facility] [I63.9] Patient Active Problem List   Diagnosis Date Noted   Acute CVA (cerebrovascular accident) (HCC) 06/16/2022   Alcohol abuse 06/16/2022   Hypertension 06/16/2022   Hyperglycemia 06/16/2022   Lung infiltrate on CT 06/16/2022   PCP:  Patient, No Pcp Per Pharmacy:   [facility] [address] [place], [place] - [address] [place] [address] Phone: [phone] Fax: [phone]     Social Determinants of Health (SDOH) Social History: SDOH Screenings   Food Insecurity: No Food Insecurity (06/17/2022)  Housing: Low Risk  (06/17/2022)  Transportation Needs: No Transportation  Needs (06/17/2022)  Utilities: Not At Risk (06/17/2022)  Tobacco Use: Medium Risk (06/17/2022)   SDOH Interventions:     Readmission Risk Interventions     No data to display

## 2022-06-17 NOTE — Progress Notes (Signed)
Speech Language Pathology Treatment: Dysphagia  Patient Details Name: Wesley Barry MRN: [id number] DOB: 09-13-1955 Today's Date: 06/17/2022 Time: 4098-1191 SLP Time Calculation (min) (ACUTE ONLY): 40 min  Assessment / Plan / Recommendation Clinical Impression  Pt seen for ongoing assessment of swallowing and trials to upgrade diet consistency today. Pt is exhibiting min improvements in his speech(significant Dysarthria s/p this CVA) and in his swallowing - bolus management.  Pt awake, verbal -- he followed verbal cues to Slow Down and Over-articulate to increase intelligibility of speech. He fed himself w/ setup support. Family present.  On  O2 support 1-2L; afebrile, WBC wnl.   Pt consumed po trials of Nectar liquids via cup and trials of MINCED food w/ No overt clinicial s/s of aspiration noted; No decline in respiratory status nor vocal quality b/t trials. Pt was able to maintain head forward position w/out need for head turn R during swallowing this session. Oral phase c/b improved bolus management w/ no anterior leakage, full lingual sweeping even on R side of cheek/buccal area. Noted MILD+ decreased sensation and lingual weakness for complete A-P transfer and clearing as MIN bolus residue from cookie trials remained along R lingual edge/side. Pt used lingual sweeping and f/u, Dry swallow AND alternating food/liquid to clear orally b/t food trials.   Recommend upgrade to Dysphagia level 2 (MINCED) foods w/ Nectar liquids via Cup; aspiration precautions. Time b/t trials for lingual sweeping and Dry swallowing. Feeding support/setup and Supervision for follow through w/ precautions. Pills Crushed vs Whole in puree for safety. Reduce Distractions during meals and check for oral clearing during/post intake as needed. NSG/MD updated.  ST services will f/u w/ ongoing toleration of diet w/ trials to upgrade diet as safe, appropriate; MBSS as indicated next 1-2 days. ST services will f/u w/ SLE for  assessment and tx recommendations. Encouraged pt to continue his speech strategies as given to him thus far: over-articulate, slow down, increase volume. Pt and Family agreed.    HPI HPI: Pt is a 67 y.o. male with medical history significant of Alcohol use, Obesity, remote history of tobacco use, history of shingles presenting with new CVA.  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      SLP Plan  Continue with current plan of care      Recommendations for follow up therapy are one component of a multi-disciplinary discharge planning process, led by the attending physician.  Recommendations may be updated based on patient status, additional functional criteria and insurance authorization.    Recommendations  Diet recommendations: Dysphagia 2 (fine chop);Nectar-thick liquid Liquids provided via: Cup;No straw Medication Administration: Crushed with puree (vs Whole in Puree if able) Supervision: Patient able to self feed;Staff to assist with self feeding;Intermittent supervision to cue for compensatory strategies (RUE weakness) Compensations: Minimize environmental distractions;Slow rate;Small sips/bites;Lingual sweep for clearance of pocketing;Multiple dry swallows after each bite/sip;Follow solids with liquid Postural Changes and/or Swallow Maneuvers: Out of bed for meals;Seated upright 90 degrees;Upright 30-60 min after meal (REFLUX precs)                 (Dietician f/u) Oral care BID;Oral care before and after PO;Staff/trained caregiver to provide oral care (support)   Frequent or constant Supervision/Assistance Dysphagia, oropharyngeal phase (R13.12)     Continue with current plan of care       Wesley Som, MS, CCC-SLP Speech Language Pathologist  Rehab Services; [facility] [place] [phone]  (ascom) Wesley Barry  06/17/2022, 4:32 PM

## 2022-06-17 NOTE — NC FL2 (Signed)
[place] MEDICAID FL2 LEVEL OF CARE FORM     IDENTIFICATION  Patient Name: Wesley Barry Birthdate: 07-12-1955 Sex: male Admission Date (Current Location): 06/16/2022  [facility] and [place] Number:  Chiropodist and Address:  [facility], [address] [place], [place] [address]      Provider Number: [id number]  Attending Physician Name and Address:  Gillis Santa, MD  Relative Name and Phone Number:  Jomarie Longs (son) [phone]    Current Level of Care: Hospital Recommended Level of Care: Skilled Nursing Facility Prior Approval Number:    Date Approved/Denied:   PASRR Number:    Discharge Plan: SNF    Current Diagnoses: Patient Active Problem List   Diagnosis Date Noted   Acute CVA (cerebrovascular accident) (HCC) 06/16/2022   Alcohol abuse 06/16/2022   Hypertension 06/16/2022   Hyperglycemia 06/16/2022   Lung infiltrate on CT 06/16/2022    Orientation RESPIRATION BLADDER Height & Weight     Self, Situation, Place, Time  Normal Incontinent, External catheter Weight: 260 lb (117.9 kg) Height:  5\' 9"  (175.3 cm)  BEHAVIORAL SYMPTOMS/MOOD NEUROLOGICAL BOWEL NUTRITION STATUS      Continent Diet (see discharge summary)  AMBULATORY STATUS COMMUNICATION OF NEEDS Skin   Extensive Assist Verbally Normal                       Personal Care Assistance Level of Assistance  Bathing, Feeding, Dressing, Total care Bathing Assistance: Maximum assistance Feeding assistance: Limited assistance Dressing Assistance: Maximum assistance Total Care Assistance: Maximum assistance   Functional Limitations Info  Sight, Hearing, Speech Sight Info: Adequate Hearing Info: Adequate Speech Info: Adequate    SPECIAL CARE FACTORS FREQUENCY  PT (By licensed PT), OT (By licensed OT)     PT Frequency: min 4x weekly OT Frequency: min 4x weekly            Contractures Contractures Info: Not present    Additional Factors Info  Code  Status, Allergies Code Status Info: full Allergies Info: Tylenol With Codeine #3 (Acetaminophen-codeine)           Current Medications (06/17/2022):  This is the current hospital active medication list Current Facility-Administered Medications  Medication Dose Route Frequency Provider Last Rate Last Admin    stroke: early stages of recovery book   Does not apply Once Floydene Flock, MD       aspirin EC tablet 81 mg  81 mg Oral Daily Floydene Flock, MD   81 mg at 06/17/22 0829   atorvastatin (LIPITOR) tablet 80 mg  80 mg Oral QHS Gillis Santa, MD       clopidogrel (PLAVIX) tablet 75 mg  75 mg Oral Daily Floydene Flock, MD   75 mg at 06/17/22 5621   folic acid (FOLVITE) tablet 1 mg  1 mg Oral Daily Floydene Flock, MD   1 mg at 06/17/22 0830   insulin aspart (novoLOG) injection 0-15 Units  0-15 Units Subcutaneous Q4H Floydene Flock, MD   5 Units at 06/17/22 1228   labetalol (NORMODYNE) injection 10 mg  10 mg Intravenous Q2H PRN Floydene Flock, MD       lisinopril (ZESTRIL) tablet 5 mg  5 mg Oral Daily Gillis Santa, MD   5 mg at 06/17/22 1224   LORazepam (ATIVAN) tablet 1-4 mg  1-4 mg Oral Q1H PRN Floydene Flock, MD       Or   LORazepam (ATIVAN) injection 1-4 mg  1-4 mg Intravenous Q1H PRN Floydene Flock, MD   1 mg at 06/16/22 2206   metoprolol succinate (TOPROL-XL) 24 hr tablet 25 mg  25 mg Oral Daily Gillis Santa, MD   25 mg at 06/17/22 1224   midazolam (VERSED) injection 2 mg  2 mg Intravenous Once Delton Prairie, MD       multivitamin with minerals tablet 1 tablet  1 tablet Oral Daily Floydene Flock, MD   1 tablet at 06/17/22 2956   thiamine (VITAMIN B1) tablet 100 mg  100 mg Oral Daily Floydene Flock, MD   100 mg at 06/17/22 2130   Or   thiamine (VITAMIN B1) injection 100 mg  100 mg Intravenous Daily Floydene Flock, MD   100 mg at 06/16/22 1208   No current outpatient medications on file.     Discharge Medications: Please see discharge summary for a list of  discharge medications.  Relevant Imaging Results:  Relevant Lab Results:   Additional Information SSN: [id number]  Darolyn Rua, LCSW

## 2022-06-17 NOTE — Progress Notes (Signed)
Occupational Therapy Treatment Patient Details Name: Wesley Barry MRN: [id number] DOB: 09-14-1955 Today's Date: 06/17/2022   History of present illness Pt is a 67 y.o. male with medical history significant of alcohol use, obesity, remote history of tobacco use, history of shingles presenting with CVA.  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   OT comments  Pt seen for OT treatment on this date. Upon arrival to room pt was awake and upright in bed, agreeable to tx. Pt requires MIN A +2 for Sit<>Stand with bed elevated and R knee blocking. Pt requires MOD A +2 for Sit<>Stand with bed at standard height. Tolerated 6x, sit<>stand. MOD A for bathing LE and peri care. Cues for aspiration precautions while drinking. Trace movement noted in R index finger. Pt tolerated session on room air maintaining Sp02 in mid 90's. Sp02 dropped to 88% while laying down for bed mobility, left on 2L. Pt making good progress toward goals, will continue to follow POC. Discharge recommendation remains appropriate.     Recommendations for follow up therapy are one component of a multi-disciplinary discharge planning process, led by the attending physician.  Recommendations may be updated based on patient status, additional functional criteria and insurance authorization.    Assistance Recommended at Discharge Frequent or constant Supervision/Assistance  Patient can return home with the following  Two people to help with walking and/or transfers;Two people to help with bathing/dressing/bathroom;Help with stairs or ramp for entrance   Equipment Recommendations  Other (comment) (defer to next venue of care)    Recommendations for Other Services      Precautions / Restrictions Precautions Precautions: Fall Restrictions Weight Bearing Restrictions: No        Mobility Bed Mobility Overal bed mobility: Needs Assistance Bed Mobility: Supine to Sit, Sit to Supine     Supine to sit: Max assist, +2 for physical assistance Sit to supine: Max assist, +2 for physical assistance   General bed mobility comments: +2 heavy assist for BLE and trunk control    Transfers Overall transfer level: Needs assistance   Transfers: Sit to/from Stand Sit to Stand: Min assist, +2 physical assistance, From elevated surface           General transfer comment: MOD A +2 standard hieght, R knee block provided to improve standing balance     Balance Overall balance assessment: Needs assistance Sitting-balance support: Single extremity supported Sitting balance-Leahy Scale: Fair   Postural control: Right lateral lean Standing balance support: Single extremity supported, During functional activity, Reliant on assistive device for balance Standing balance-Leahy Scale: Poor Standing balance comment: Frequent RLE buckling in standing requiring assist/blocking R knee to prevent falls                           ADL either performed or assessed with clinical judgement   ADL Overall ADL's : Needs assistance/impaired Eating/Feeding: Set up;Cueing for compensatory techinques;Cueing for safety Eating/Feeding Details (indicate cue type and reason): Pt instructed on pacing while drinking provided liquids         Lower Body Bathing: Moderate assistance;With caregiver independent assisting;Sit to/from stand;+2 for physical assistance                         General ADL Comments: MAX A don B socks seated EOB. MOD A don gown  seated EOB. MOD A UB bathing seated EOB    Extremity/Trunk Assessment              Vision       Perception     Praxis      Cognition Arousal/Alertness: Awake/alert Behavior During Therapy: WFL for tasks assessed/performed Overall Cognitive Status: Difficult to assess                                  General Comments: Pt followed commands with time and cues.        Exercises Exercises: Other exercises Other Exercises Other Exercises: L lateral weight shifting in sitting to define midline; 1x10 seated EOB: R lateral lean, R knee flex/ext, R foot flex/ext, Shoulder shrugs, Scapula retractions    Shoulder Instructions       General Comments      Pertinent Vitals/ Pain       Pain Assessment Pain Assessment: No/denies pain  Home Living                                          Prior Functioning/Environment              Frequency  Min 2X/week        Progress Toward Goals  OT Goals(current goals can now be found in the care plan section)  Progress towards OT goals: Progressing toward goals  Acute Rehab OT Goals Patient Stated Goal: to return to PLOF OT Goal Formulation: With patient Time For Goal Achievement: 06/30/22 Potential to Achieve Goals: Good ADL Goals Pt Will Perform Eating: with set-up;sitting;with min guard assist;with caregiver independent in assisting Pt Will Perform Upper Body Dressing: sitting;with min assist;with caregiver independent in assisting Pt Will Transfer to Toilet: with min assist;with +2 assist;bedside commode;stand pivot transfer Pt/caregiver will Perform Home Exercise Program: With minimal assist;Increased ROM;Increased strength;Right Upper extremity  Plan Discharge plan remains appropriate    Co-evaluation          OT goals addressed during session: ADL's and self-care      AM-PAC OT "6 Clicks" Daily Activity     Outcome Measure   Help from another person eating meals?: A Little Help from another person taking care of personal grooming?: A Little Help from another person toileting, which includes using toliet, bedpan, or urinal?: A Lot Help from another person bathing (including washing, rinsing, drying)?: A Lot Help from another person to put on and taking off regular upper body clothing?: A  Lot Help from another person to put on and taking off regular lower body clothing?: A Lot 6 Click Score: 14    End of Session Equipment Utilized During Treatment: Gait belt;Oxygen  OT Visit Diagnosis: Other abnormalities of gait and mobility (R26.89);Muscle weakness (generalized) (M62.81)   Activity Tolerance Patient tolerated treatment well   Patient Left in bed;with call bell/phone within reach;with family/visitor present   Nurse Communication          Time: 1610-9604 OT Time Calculation (min): 43 min  Charges: OT General Charges $OT Visit: 1 Visit OT Treatments $Self Care/Home Management : 23-37 mins $Therapeutic Exercise: 8-22 mins  Thresa Ross, OTS

## 2022-06-17 NOTE — ED Notes (Signed)
Pt reports trying to sleep, but feels short of breath. Sao2 94-95%. Pt usually wears a CPAP or O2 2l at night when he sleeps. Pt placed on 2L o2 [place] at this time.

## 2022-06-18 ENCOUNTER — Telehealth (HOSPITAL_COMMUNITY): Payer: Self-pay | Admitting: Pharmacy Technician

## 2022-06-18 ENCOUNTER — Other Ambulatory Visit (HOSPITAL_COMMUNITY): Payer: Self-pay

## 2022-06-18 DIAGNOSIS — Z794 Long term (current) use of insulin: Secondary | ICD-10-CM

## 2022-06-18 DIAGNOSIS — E1165 Type 2 diabetes mellitus with hyperglycemia: Secondary | ICD-10-CM | POA: Diagnosis not present

## 2022-06-18 DIAGNOSIS — R531 Weakness: Secondary | ICD-10-CM

## 2022-06-18 DIAGNOSIS — E118 Type 2 diabetes mellitus with unspecified complications: Secondary | ICD-10-CM

## 2022-06-18 DIAGNOSIS — I639 Cerebral infarction, unspecified: Secondary | ICD-10-CM | POA: Diagnosis not present

## 2022-06-18 DIAGNOSIS — F1010 Alcohol abuse, uncomplicated: Secondary | ICD-10-CM | POA: Diagnosis not present

## 2022-06-18 LAB — CBC
HCT: 49.9 % (ref 39.0–52.0)
Hemoglobin: 15.7 g/dL (ref 13.0–17.0)
MCH: 28.6 pg (ref 26.0–34.0)
MCHC: 31.5 g/dL (ref 30.0–36.0)
MCV: 90.9 fL (ref 80.0–100.0)
Platelets: 221 10*3/uL (ref 150–400)
RBC: 5.49 MIL/uL (ref 4.22–5.81)
RDW: 12.4 % (ref 11.5–15.5)
WBC: 7 10*3/uL (ref 4.0–10.5)
nRBC: 0 % (ref 0.0–0.2)

## 2022-06-18 LAB — BASIC METABOLIC PANEL
Anion gap: 10 (ref 5–15)
BUN: 16 mg/dL (ref 8–23)
CO2: 26 mmol/L (ref 22–32)
Calcium: 9 mg/dL (ref 8.9–10.3)
Chloride: 101 mmol/L (ref 98–111)
Creatinine, Ser: 0.89 mg/dL (ref 0.61–1.24)
GFR, Estimated: >60 mL/min (ref >60)
Glucose, Bld: 184 mg/dL — ABNORMAL HIGH (ref 70–99)
Potassium: 3.8 mmol/L (ref 3.5–5.1)
Sodium: 137 mmol/L (ref 135–145)

## 2022-06-18 LAB — GLUCOSE, CAPILLARY
Glucose-Capillary: 166 mg/dL — ABNORMAL HIGH (ref 70–99)
Glucose-Capillary: 173 mg/dL — ABNORMAL HIGH (ref 70–99)
Glucose-Capillary: 188 mg/dL — ABNORMAL HIGH (ref 70–99)
Glucose-Capillary: 190 mg/dL — ABNORMAL HIGH (ref 70–99)
Glucose-Capillary: 240 mg/dL — ABNORMAL HIGH (ref 70–99)
Glucose-Capillary: 250 mg/dL — ABNORMAL HIGH (ref 70–99)

## 2022-06-18 LAB — PHOSPHORUS: Phosphorus: 5.6 mg/dL — ABNORMAL HIGH (ref 2.5–4.6)

## 2022-06-18 LAB — MAGNESIUM: Magnesium: 2.7 mg/dL — ABNORMAL HIGH (ref 1.7–2.4)

## 2022-06-18 MED ORDER — OXYCODONE HCL 5 MG PO TABS: 5.0000 mg | ORAL_TABLET | Freq: Four times a day (QID) | ORAL | Status: DC | PRN

## 2022-06-18 MED ORDER — LOSARTAN POTASSIUM 25 MG PO TABS
12.5000 mg | ORAL_TABLET | Freq: Every day | ORAL | Status: DC
  Administered 2022-06-19: 12.5 mg via ORAL
  Filled 2022-06-18: qty 1

## 2022-06-18 MED ORDER — LIVING WELL WITH DIABETES BOOK
Freq: Once | Status: AC
  Filled 2022-06-18: qty 1

## 2022-06-18 MED ORDER — LISINOPRIL 5 MG PO TABS: 5.0000 mg | ORAL_TABLET | Freq: Every day | ORAL | Status: DC

## 2022-06-18 MED ORDER — ACETAMINOPHEN 325 MG PO TABS: 650.0000 mg | ORAL_TABLET | Freq: Four times a day (QID) | ORAL | Status: DC | PRN

## 2022-06-18 MED ORDER — NEPRO/CARBSTEADY PO LIQD
237.0000 mL | Freq: Three times a day (TID) | ORAL | Status: DC
  Administered 2022-06-18 – 2022-06-19 (×4): 237 mL via ORAL

## 2022-06-18 MED ORDER — METOPROLOL SUCCINATE ER 25 MG PO TB24: 25.0000 mg | ORAL_TABLET | Freq: Every day | ORAL | Status: DC

## 2022-06-18 NOTE — TOC Benefit Eligibility Note (Addendum)
Patient Product/process development scientist completed.    The patient is currently admitted and upon discharge could be taking Jardiance 10 mg.  The current 30 day co-pay is $45.00.   The patient is currently admitted and upon discharge could be taking Lantus Pen.  The current 30 day co-pay is $35.00.   The patient is currently admitted and upon discharge could be taking Novolog Flexpen  The current 30 day co-pay is $35.00.   The patient is currently admitted and upon discharge could be taking Dexcom G7 Sensors.  Requires Prior Authorization  The patient is currently admitted and upon discharge could be taking Freestyle [employer].  Requires Prior Authorization  The patient is insured through [facility] Part D   This test claim was processed through [facility]- copay amounts may vary at other pharmacies due to pharmacy/plan contracts, or as the patient moves through the different stages of their insurance plan.  Roland Earl, CPHT Pharmacy Patient Advocate Specialist [facility] Patient Advocate Team Direct Number: [phone]  Fax: [phone]

## 2022-06-18 NOTE — Progress Notes (Addendum)
Initial Nutrition Assessment  DOCUMENTATION CODES:   Obesity unspecified  INTERVENTION:   -Nepro Shake po BID, each supplement provides 425 kcal and 19 grams protein -MVI with minerals daily -Magic cup TID with meals, each supplement provides 290 kcal and 9 grams of protein  -RD provided education and DM diet and diabetes self-management -RD referred to [facility] for further reinforcement  NUTRITION DIAGNOSIS:   Inadequate oral intake related to poor appetite as evidenced by per patient/family report.  GOAL:   Patient will meet greater than or equal to 90% of their needs  MONITOR:   PO intake, Supplement acceptance  REASON FOR ASSESSMENT:   Consult Diet education  ASSESSMENT:   Pt with medical history significant of alcohol use, obesity, remote history of tobacco use, history of shingles presenting with CVA  Pt admitted with acute ischemic CVA.   5/14- s/p BSE- dysphagia 1 diet with nectar thick liquids 5/15- s.p BSE- advanced to dysphagia 2 diet with nectar thick liquids  Reviewed I/O's: -340 ml x 24 hours  UOP: 400 ml x 24 hours  Spoke with pt, son, and daughter, at bedside. Pt reports feeling better today and was laughing and joking with this RD during visit. Per family members, pt was eating well PTA, but was just diagnosed with DM this hospitalization. Pt is active (outdoorsy and works in his garage) and consumes 2-3 meals per day. Pt enjoys cooking his own food and smokes a lot of pork in his smoker. Pt also prepares sides such as green beans, pinot beans, casseroles, and salads. Pt reports not eating much currently and family reports drinking very little due to dislike of thickened liquids. Reviewed SLP recommendations and rationale for thickened liquids currently; pt and family understand rationale for liquids and hopeful that this can be upgraded soon. Noted meal completions 10%, but pt reports he ate "better" (about  50%) of breakfast this morning.   Pt denies any weight loss. Noted some wt discrepancies. No wt history available to assess at this time.   RD provided "Carbohydrate Counting for People with Diabetes" handout from the Academy of Nutrition and Dietetics. Discussed different food groups and their effects on blood sugar, emphasizing carbohydrate-containing foods. Provided list of carbohydrates and recommended serving sizes of common foods.  Discussed importance of controlled and consistent carbohydrate intake throughout the day. Provided examples of ways to balance meals/snacks and encouraged intake of high-fiber, whole grain complex carbohydrates. Teach back method used.  Expect fair compliance.  RD focused education on ways to increase oral intake and overall self-management. Case discussed with diabetes coordinator. Discussed importance of good meal and supplement intake to promote healing. Pt amenable to supplements.   Plan for SNF at discharge.   Medications reviewed and include folic acid and thiamine.   Lab Results  Component Value Date   HGBA1C 11.5 (H) 06/16/2022   PTA DM medications are none.   Labs reviewed: CBGS: 173-240 (inpatient orders for glycemic control are 0-15 units insulin aspart every 4 hours and 10 units inuslin glargine-yfgn daily at bedtime).    NUTRITION - FOCUSED PHYSICAL EXAM:  Flowsheet Row Most Recent Value  Orbital Region No depletion  Upper Arm Region No depletion  Thoracic and Lumbar Region No depletion  Buccal Region No depletion  Temple Region No depletion  Clavicle Bone Region No depletion  Clavicle and Acromion Bone Region No depletion  Scapular Bone Region No depletion  Dorsal Hand No depletion  Patellar Region No depletion  Anterior Thigh Region No depletion  Posterior Calf Region No depletion  Edema (RD Assessment) Mild  Hair Reviewed  Eyes Reviewed  Mouth Reviewed  Skin Reviewed  Nails Reviewed       Diet Order:   Diet Order              DIET DYS 2 Room service appropriate? Yes with Assist; Fluid consistency: Nectar Thick  Diet effective now                   EDUCATION NEEDS:   Education needs have been addressed  Skin:  Skin Assessment: Reviewed RN Assessment  Last BM:  06/15/22  Height:   Ht Readings from Last 1 Encounters:  06/16/22 5\' 9"  (1.753 m)    Weight:   Wt Readings from Last 1 Encounters:  06/18/22 112.2 kg    Ideal Body Weight:  72.7 kg  BMI:  Body mass index is 36.53 kg/m.  Estimated Nutritional Needs:   Kcal:  1800-2000  Protein:  90-105 grams  Fluid:  > 1.8 L    Levada Schilling, RD, LDN, CDCES Registered Dietitian II Certified Diabetes Care and Education Specialist Please refer to [facility] for RD and/or RD on-call/weekend/after hours pager

## 2022-06-18 NOTE — Progress Notes (Signed)
Mobility Specialist - Progress Note   06/18/22 1500  Mobility  Activity Stood at bedside  Level of Assistance Moderate assist, patient does 50-74%  Assistive Device Other (Comment) (Hemiwalker)  Distance Ambulated (ft) 2 ft  Activity Response Tolerated well  $Mobility charge 1 Mobility     Pt dangling EOB on arrival, requesting assistance to return supine. Pt able to stand with minA +2 and lateral transfer to [facility] positioning with modA +2 before returning supine. R knee blocking and foot sliding assist. Assist on LE to return supine. Pillow wedge support on R side. Pt left in bed with alarm set, needs in reach. Family at bedside.    Filiberto Pinks Mobility Specialist 06/18/22, 3:47 PM

## 2022-06-18 NOTE — Progress Notes (Signed)
Physical Therapy Treatment Patient Details Name: Wesley Barry MRN: [id number] DOB: [birth year] Today's Date: 06/18/2022   History of Present Illness Pt is a 67 y.o. male with medical history significant of alcohol use, obesity, remote history of tobacco use, history of shingles presenting with CVA.  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    PT Comments    Pt admitted with above diagnosis. Pt currently with functional limitations due to the deficits listed below (see PT Problem List). Pt received upright in bed agreeable to PT with supportive family present. Pt reliant on 2 person assist for supine to sitting and sitting to supine. Pt displaying excellent static sitting with brief LUE support at bedside without LOB. Sling provided to support flaccid RUE dependent for donning educating family how to don/doff. X3 STS performed with elevated bed at first and minA+2, but upon f/u STS' pt completes from lowered bed and minguard only initially needing R knee blocking but progressing to not requiring R knee blocking with absence of buckling. 2x5 R lateral leans provided to initiate RLE strengthening progressing to R side steps requiring dependent mobilization of RLE. Pt returns to supine with all needs in reach. SPO2 and HR WNL throughout session. Pt is making good progress towards PT goals but still remains a high falls risk. D/c recs remain appropriate.      Recommendations for follow up therapy are one component of a multi-disciplinary discharge planning process, led by the attending physician.  Recommendations may be updated based on patient status, additional functional criteria and insurance authorization.  Follow Up Recommendations  Can patient physically be transported by private vehicle: No    Assistance Recommended at Discharge Frequent  or constant Supervision/Assistance  Patient can return home with the following Two people to help with walking and/or transfers;Two people to help with bathing/dressing/bathroom;Assistance with cooking/housework;Direct supervision/assist for medications management;Help with stairs or ramp for entrance;Assist for transportation   Equipment Recommendations  Other (comment) (TBD by next venue of care)    Recommendations for Other Services       Precautions / Restrictions Precautions Precautions: Fall Restrictions Weight Bearing Restrictions: No     Mobility  Bed Mobility Overal bed mobility: Needs Assistance Bed Mobility: Supine to Sit, Sit to Supine     Supine to sit: Mod assist, +2 for physical assistance Sit to supine: Max assist, +2 for physical assistance   General bed mobility comments: +2 heavy assist for BLE and trunk control Patient Response: Cooperative  Transfers Overall transfer level: Needs assistance Equipment used: Hemi-walker Transfers: Sit to/from Stand Sit to Stand: Min guard, Min assist, +2 physical assistance, From elevated surface           General transfer comment: Initially needing minA+2 for frist STS with bed elevated. Able to progress to minguard +2 and bed at lowest position    Ambulation/Gait                   Stairs             Wheelchair Mobility    Modified Rankin (Stroke Patients Only)       Balance Overall balance assessment: Needs assistance Sitting-balance support: Single extremity supported Sitting balance-Leahy Scale: Fair       Standing balance-Leahy Scale: Fair Standing balance comment: Initially RLE buckling but is absent after second and third attempts using hemi walker in  LUE.                            Cognition Arousal/Alertness: Awake/alert Behavior During Therapy: WFL for tasks assessed/performed Overall Cognitive Status: Within Functional Limits for tasks assessed                                           Exercises Other Exercises Other Exercises: RLE knee felxion with manual resistance and extension AAROM, standing R lateral leans 2x5 per STS    General Comments        Pertinent Vitals/Pain Pain Assessment Pain Assessment: No/denies pain    Home Living                          Prior Function            PT Goals (current goals can now be found in the care plan section) Acute Rehab PT Goals PT Goal Formulation: Patient unable to participate in goal setting    Frequency    Min 4X/week      PT Plan Current plan remains appropriate    Co-evaluation              AM-PAC PT "6 Clicks" Mobility   Outcome Measure  Help needed turning from your back to your side while in a flat bed without using bedrails?: A Lot Help needed moving from lying on your back to sitting on the side of a flat bed without using bedrails?: A Lot Help needed moving to and from a bed to a chair (including a wheelchair)?: Total Help needed standing up from a chair using your arms (e.g., wheelchair or bedside chair)?: A Little Help needed to walk in hospital room?: Total Help needed climbing 3-5 steps with a railing? : Total 6 Click Score: 10    End of Session Equipment Utilized During Treatment: Gait belt Activity Tolerance: Patient tolerated treatment well Patient left: in bed;with call bell/phone within reach;with family/visitor present Nurse Communication: Mobility status PT Visit Diagnosis: Unsteadiness on feet (R26.81);Difficulty in walking, not elsewhere classified (R26.2);Muscle weakness (generalized) (M62.81);Hemiplegia and hemiparesis Hemiplegia - Right/Left: Right     Time: 7253-6644 PT Time Calculation (min) (ACUTE ONLY): 24 min  Charges:  $Therapeutic Exercise: 23-37 mins                    Naeemah Jasmer M. Fairly IV, PT, DPT Physical Therapist- [place]  [facility]  06/18/2022, 2:46 PM

## 2022-06-18 NOTE — Inpatient Diabetes Management (Signed)
Inpatient Diabetes Program Recommendations  AACE/ADA: New Consensus Statement on Inpatient Glycemic Control (2015)  Target Ranges:  Prepandial:   less than 140 mg/dL      Peak postprandial:   less than 180 mg/dL (1-2 hours)      Critically ill patients:  140 - 180 mg/dL   Lab Results  Component Value Date   GLUCAP 240 (H) 06/18/2022   HGBA1C 11.5 (H) 06/16/2022    Review of Glycemic Control  Latest Reference Range & Units 06/18/22 08:02 06/18/22 11:41  Glucose-Capillary 70 - 99 mg/dL 161 (H) 096 (H)  (H): Data is abnormally high  Diabetes history: DM2-new Outpatient Diabetes medications: None Current orders for Inpatient glycemic control: Semglee 10 units QD, Novolog 0-15 units Q4H  Met with Mr. Healan, daughter and son.  Ordered the Living Well with Diabetes booklet this morning; they have it at bedside.  Patient has not been eating well, however did eat 100% of breakfast per chart review.  CBG was 240 mg/dL 1 hour after eating.  Will follow and if postprandials are consistently elevated would consider-Novolog 3 units TID with meals if he consumes at least 50%.    Plan for rehab at discharge.  Educated on The Plate Method, CHO's, portion control, CBGs at home fasting and mid afternoon, F/U with PCP every 3 months, bring meter to PCP office, long and short term complications of uncontrolled BG, and importance of exercise.  Asked pharmacy for a benefit check on insulins.  Lantus and Novolog are covered with $35 co-pay each.  Both the Dexcom and the [employer] require a prior authorizations.  Appears he will be starting Jardiance which should help improve glucose trends.    Will continue to follow while inpatient.  Thank you, Dulce Sellar, MSN, CDCES Diabetes Coordinator Inpatient Diabetes Program [phone] (team pager from 8a-5p)

## 2022-06-18 NOTE — Progress Notes (Signed)
Occupational Therapy Treatment Patient Details Name: Wesley Barry MRN: [id number] DOB: Apr 08, 1955 Today's Date: 06/18/2022   History of present illness Pt is a 67 y.o. male with medical history significant of alcohol use, obesity, remote history of tobacco use, history of shingles presenting with CVA.  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   OT comments  Pt seen for OT treatment on this date. Upon arrival to room pt supine in bed, agreeable to tx. Pt requires MOD A +2 for safety during stand/pivot. Balance remains impaired in both seated and standing position with right lateral lean. Session was ended short due to MD walking in the room. Pt making good progress toward goals, will continue to follow POC. Discharge recommendation remains appropriate.     Recommendations for follow up therapy are one component of a multi-disciplinary discharge planning process, led by the attending physician.  Recommendations may be updated based on patient status, additional functional criteria and insurance authorization.    Assistance Recommended at Discharge Frequent or constant Supervision/Assistance  Patient can return home with the following  Two people to help with walking and/or transfers;Two people to help with bathing/dressing/bathroom;Help with stairs or ramp for entrance   Equipment Recommendations  Other (comment) (defer to next venue of care)    Recommendations for Other Services      Precautions / Restrictions Precautions Precautions: Fall Restrictions Weight Bearing Restrictions: No       Mobility Bed Mobility Overal bed mobility: Needs Assistance Bed Mobility: Supine to Sit, Sit to Supine     Supine to sit: Mod assist, +2 for physical assistance          Transfers Overall transfer level: Needs  assistance Equipment used: Hemi-walker Transfers: Sit to/from Stand Sit to Stand: Mod assist, +2 safety/equipment           General transfer comment: MOD A on effected side, R knee blocking provided to improve standing balance.     Balance Overall balance assessment: Needs assistance Sitting-balance support: Single extremity supported Sitting balance-Leahy Scale: Fair   Postural control: Right lateral lean Standing balance support: Single extremity supported, During functional activity, Reliant on assistive device for balance Standing balance-Leahy Scale: Poor                             ADL either performed or assessed with clinical judgement   ADL Overall ADL's : Needs assistance/impaired                         Toilet Transfer: Moderate assistance;+2 for safety/equipment;Stand-pivot                  Extremity/Trunk Assessment Upper Extremity Assessment Upper Extremity Assessment: RUE deficits/detail RUE Deficits / Details: 0/5 grossly RUE: Shoulder pain at rest RUE Sensation: WNL RUE Coordination: decreased gross motor;decreased fine motor            Vision       Perception     Praxis      Cognition Arousal/Alertness: Awake/alert Behavior During Therapy: WFL for tasks assessed/performed Overall Cognitive Status: Within Functional Limits for tasks assessed                                 General  Comments: Pt followed commands with time and cues.        Exercises      Shoulder Instructions       General Comments      Pertinent Vitals/ Pain       Pain Assessment Pain Assessment: No/denies pain  Home Living                                          Prior Functioning/Environment              Frequency  Min 2X/week        Progress Toward Goals  OT Goals(current goals can now be found in the care plan section)  Progress towards OT goals: Progressing toward goals  Acute  Rehab OT Goals Patient Stated Goal: to return to PLOF OT Goal Formulation: With patient Time For Goal Achievement: 06/30/22 Potential to Achieve Goals: Good ADL Goals Pt Will Perform Eating: with set-up;sitting;with min guard assist;with caregiver independent in assisting Pt Will Perform Upper Body Dressing: sitting;with min assist;with caregiver independent in assisting Pt Will Transfer to Toilet: with min assist;with +2 assist;bedside commode;stand pivot transfer Pt/caregiver will Perform Home Exercise Program: With minimal assist;Increased ROM;Increased strength;Right Upper extremity  Plan Discharge plan remains appropriate    Co-evaluation                 AM-PAC OT "6 Clicks" Daily Activity     Outcome Measure   Help from another person eating meals?: A Little Help from another person taking care of personal grooming?: A Little Help from another person toileting, which includes using toliet, bedpan, or urinal?: A Lot Help from another person bathing (including washing, rinsing, drying)?: A Lot Help from another person to put on and taking off regular upper body clothing?: A Lot Help from another person to put on and taking off regular lower body clothing?: A Lot 6 Click Score: 14    End of Session Equipment Utilized During Treatment: Gait belt;Oxygen;Other (comment) (Hemiwalker)  OT Visit Diagnosis: Other abnormalities of gait and mobility (R26.89);Muscle weakness (generalized) (M62.81)   Activity Tolerance Patient tolerated treatment well   Patient Left in chair;with call bell/phone within reach;with family/visitor present   Nurse Communication          Time: 0981-1914 OT Time Calculation (min): 12 min  Charges: OT General Charges $OT Visit: 1 Visit OT Treatments $Self Care/Home Management : 8-22 mins  Thresa Ross, OTS

## 2022-06-18 NOTE — Evaluation (Signed)
Speech Language Pathology Evaluation Patient Details Name: Wesley Barry MRN: [id number] DOB: May 10, 1955 Today's Date: 06/18/2022 Time: 4098-1191 SLP Time Calculation (min) (ACUTE ONLY): 20 min  Problem List:  Patient Active Problem List   Diagnosis Date Noted   Right sided weakness 06/18/2022   Type 2 diabetes mellitus with hyperglycemia, without long-term current use of insulin (HCC) 06/18/2022   Diabetes mellitus type 2 with complications (HCC) 06/18/2022   Acute CVA (cerebrovascular accident) (HCC) 06/16/2022   Alcohol abuse 06/16/2022   Hypertension 06/16/2022   Hyperglycemia 06/16/2022   Lung infiltrate on CT 06/16/2022   Past Medical History:  Past Medical History:  Diagnosis Date   History of shingles 2021   Past Surgical History:  Past Surgical History:  Procedure Laterality Date   HERNIA REPAIR     HPI:  Pt is a 67 y.o. male with medical history significant of Alcohol use, Obesity, remote history of tobacco use, history of shingles presenting with new CVA.  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 Attempted initiation of formal cognitive screen; however, internal distractions (fatigue and pain) limited full completion. Dynamic assessment and pt/family interview utilized instead. Pt presents with at least moderate dysarthria, c/b imprecise articulation and irregular speech rate. Pt stimuable for use of over-articulation for compensation and demonstrated emerging awareness for dysarthria and need for communication repair. Additionally, at least mild deficits noted in complex problem solving and attention, with pt benefiting from  extended time and redirection for completion of verbal tasks. Auditory comprehension, orientation, and visual processing grossly intact. Per pt/son reported limited literacy at baseline. Further cognitive linguistic assessment is warranted. SLP will continue to follow during inpatient stay. Recommend follow up services (SNF). Patient and son aware and in agreement with results and recommendations.    SLP Assessment  SLP Recommendation/Assessment: Patient needs continued Speech Lanaguage Pathology Services SLP Visit Diagnosis: Dysphagia, oropharyngeal phase (R13.12);Dysarthria and anarthria (R47.1);Cognitive communication deficit (R41.841)    Recommendations for follow up therapy are one component of a multi-disciplinary discharge planning process, led by the attending physician.  Recommendations may be updated based on patient status, additional functional criteria and insurance authorization.    Follow Up Recommendations  Follow physician's recommendations for discharge plan and follow up therapies    Assistance Recommended at Discharge  Frequent or constant Supervision/Assistance  Functional Status Assessment Patient has had a recent decline in their functional status and demonstrates the ability to make significant improvements in function in a reasonable and predictable amount of time.  Frequency and Duration min 2x/week  2 weeks      SLP Evaluation Cognition  Overall Cognitive Status: Impaired/Different from baseline Arousal/Alertness: Lethargic Orientation Level: Oriented to person;Oriented to place;Oriented to time Attention: Selective;Alternating Selective Attention: Impaired Selective Attention Impairment: Verbal basic Alternating Attention: Impaired Alternating Attention Impairment: Verbal basic Memory:  (DNT) Awareness:  (emerging) Problem Solving: Impaired Problem Solving Impairment: Verbal basic Executive Function: Self Monitoring;Self Correcting Self Monitoring:  Impaired Self Monitoring Impairment: Verbal basic Self Correcting: Impaired Self Correcting Impairment: Verbal basic (simple calculation 2/3) Safety/Judgment: Impaired (some impulsivity noted during transfer)       Comprehension  Auditory Comprehension Overall Auditory Comprehension: Appears within functional limits for tasks assessed [facility] for following  commands for transfer) Visual Recognition/Discrimination Discrimination: Within Function Limits [facility] for reading clock) Reading Comprehension Reading Status: Not tested (low literacy reported at baseline)    Expression Expression Primary Mode of Expression: Verbal Verbal Expression Overall Verbal Expression: Other (comment) (limited assessment of expressive language secondary to reduced verbal output. Pt utilizing gesture and short utterances primarily) Written Expression Written Expression: Not tested (low literacy at baseline)   Oral / Motor  Oral Motor/Sensory Function Overall Oral Motor/Sensory Function: Moderate impairment Facial ROM: Reduced right;Suspected CN VII (facial) dysfunction Facial Symmetry: Abnormal symmetry right;Suspected CN VII (facial) dysfunction Facial Strength: Reduced right;Suspected CN VII (facial) dysfunction Lingual ROM: Reduced right Lingual Symmetry: Abnormal symmetry right;Suspected CN XII (hypoglossal) dysfunction Lingual Strength: Reduced;Suspected CN XII (hypoglossal) dysfunction Mandible: Within Functional Limits Motor Speech Overall Motor Speech: Impaired Respiration: Within functional limits Phonation: Low vocal intensity Resonance: Within functional limits Articulation: Impaired Level of Impairment: Word Intelligibility: Intelligibility reduced Word: 50-74% accurate Sentence: 25-49% accurate Conversation: 25-49% accurate Motor Planning: Not tested Motor Speech Errors: Not applicable Interfering Components:  (pain) Effective Techniques: Slow rate;Over-articulate;Pacing    Wesley  Wesley Barry Wesley Wentworth  MS [facility] SLP   Wesley Barry 06/18/2022, 3:06 PM

## 2022-06-18 NOTE — Progress Notes (Signed)
[facility] Progress Note  Patient: Wesley Barry    ZOX:[id number]  DOA: 06/16/2022     Date of Service: the patient was seen and examined on 06/18/2022  Chief Complaint  Patient presents with   Code Stroke   Brief hospital course: ARSENIY LOSACCO is a 67 y.o. male with medical history significant of alcohol use, obesity, remote history of tobacco use, history of shingles presenting with CVA.  Patient presented to the ER around 5 AM with noted new onset right-sided weakness.  Patient went to bed around 9 PM with no deficits.  Patient does not follow with PCP. ED workup Code stroke CT head grossly stable. CTA head and neck negative for any large vessel occlusion but does show age-indeterminate occlusion of the nondominant atherosclerotic left vertebral artery V4 segment which is favored to be chronic. Moderate left MCA M1 and bilateral PCA P2 segment stenosis  Teleneuro was consulted, recommended aspirin and Plavix loading and a stroke workup. [facility] hospitalist consulted for admission and further management as below.  Assessment and Plan:  Acute ischemic CVA Patient presented with right-sided facial droop and right-sided weakness Last known normal 9 PM on 06/16/2022, out of the window for TNK Code stroke CT head and CT angio of the head and neck within normal limits apart from age indeterminate 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 MRI Brain: Acute perforator infarct in the left basal ganglia.  TTE LVEF 40 to 45%, systolic function decreased and global hypokinesia.  Grade 1 diastolic dysfunction.  No significant valvular abnormality, negative PFO Continue to monitor on telemetry, continue fall precaution aspirin precautions and turn patient every 12 hourly SLP eval done, recommended dysphagia 1 diet with nectar thick liquids. Follow PT and OT eval Neurology recommended DAPT for 90 days followed by aspirin only.  30-day cardiac monitor and  cardiology consult for low LVEF. Started aspirin 81 mg p.o. daily, Plavix 75 mg p.o. daily, Lipitor 80 mg p.o. daily LDL 220, goal <70, hemoglobin A1c 11.5, elevated. TSH level 1.5 WNL Allow permissive hypertension till tomorrow 06/19/2022 as per neurology  Acute systolic CHF Currently patient is well compensated, euvolemic TTE shows LVEF 40 to 45%, LV global hypokinesis and grade 1 diastolic dysfunction It could be secondary to EtOH abuse and stress induced cardiomyopathy Allow permissive hypertension till tomorrow 5/17 due to acute CVA Start losartan 12.5 mg daily and Toprol-XL 25 mg p.o. daily on Saturday, 06/20/2022, order placed. Cardiology started [facility] later on and consider starting SGLT2 inhibitor due to poorly controlled diabetes. Fluid restriction 1.5 L/day, monitor intake and output and daily body weight   New onset diabetes mellitus type 2 Hemoglobin A1c 11.5, uncontrolled diabetes Patient was not taking any medication.  Counseled and advised to use insulin until A1c is controlled and then patient can be switched to oral medications. Started Semglee 10 units nightly, NovoLog sliding scale, monitor CBG, continue diabetic diet. Consulted diabetic coordinator.  EtOH use disorder, abstinence counseling done. Continue CIWA protocol, monitor for withdrawal symptoms Continue multivitamin, thiamine and folic acid.   Body mass index is 38.4 kg/m.  Interventions:     Diet: Dysphagia 1 with nectar thick liquids DVT Prophylaxis: Subcutaneous Lovenox   Advance goals of care discussion: Full code  Family Communication: family was present at bedside, at the time of interview.  The pt provided permission to discuss medical plan with the family. Opportunity was given to ask question and all questions were answered satisfactorily.   Disposition:  Pt  is from Home, admitted with CVA, still has right-sided weakness, clinically stable, medically optimized to discharge to SNF.  TOC is  following, patient can be discharged to SNF when bed will be available.   Subjective: No significant events overnight, patient still has right-sided facial droop and right-sided weakness, unable to move arm, right lower extremity strength little bit better.  Denies any new complaints.  No chest pain or palpitation, no shortness of breath.  Physical Exam: General: NAD, lying comfortably Appear in no distress, affect appropriate Eyes: PERRLA ENT: Oral Mucosa Clear, moist  Neck: no JVD,  Cardiovascular: S1 and S2 Present, no Murmur,  Respiratory: good respiratory effort, Bilateral Air entry equal and Decreased, no Crackles, no wheezes Abdomen: Bowel Sound present, Soft and no tenderness,  Skin: no rashes Extremities: no Pedal edema, no calf tenderness Neurologic: Right facial droop and right hemiparesis, power 1/5 RLE and flaccid paralysis of right arm. Gait not checked due to patient safety concerns  Vitals:   06/18/22 0440 06/18/22 0500 06/18/22 0800 06/18/22 1140  BP: 125/75  115/81 105/82  Pulse: 92  78 92  Resp: 18  15 15   Temp: 97.9 F (36.6 C)  97.7 F (36.5 C) 98.1 F (36.7 C)  TempSrc:      SpO2: 96%  96% 99%  Weight:  112.2 kg    Height:        Intake/Output Summary (Last 24 hours) at 06/18/2022 1508 Last data filed at 06/18/2022 1028 Gross per 24 hour  Intake 180 ml  Output 400 ml  Net -220 ml   Filed Weights   06/16/22 0610 06/18/22 0500  Weight: 117.9 kg 112.2 kg    Data Reviewed: I have personally reviewed and interpreted daily labs, tele strips, imagings as discussed above. I reviewed all nursing notes, pharmacy notes, vitals, pertinent old records I have discussed plan of care as described above with RN and patient/family.  CBC: Recent Labs  Lab 06/16/22 0605 06/18/22 0424  WBC 4.9 7.0  NEUTROABS 2.3  --   HGB 15.4 15.7  HCT 47.4 49.9  MCV 89.3 90.9  PLT 202 221   Basic Metabolic Panel: Recent Labs  Lab 06/16/22 0605 06/17/22 0712  06/18/22 0424  NA 134* 136 137  K 3.6 3.9 3.8  CL 98 100 101  CO2 24 25 26   GLUCOSE 332* 251* 184*  BUN 13 13 16   CREATININE 0.90 0.81 0.89  CALCIUM 8.8* 9.0 9.0  MG  --  2.2 2.7*  PHOS  --  4.2 5.6*    Studies: No results found.  Scheduled Meds:   stroke: early stages of recovery book   Does not apply Once   aspirin EC  81 mg Oral Daily   atorvastatin  80 mg Oral QHS   clopidogrel  75 mg Oral Daily   enoxaparin (LOVENOX) injection  40 mg Subcutaneous QPM   feeding supplement (NEPRO CARB STEADY)  237 mL Oral TID BM   folic acid  1 mg Oral Daily   insulin aspart  0-15 Units Subcutaneous Q4H   insulin glargine-yfgn  10 Units Subcutaneous QHS   [START ON 06/19/2022] losartan  12.5 mg Oral Daily   [START ON 06/20/2022] metoprolol succinate  25 mg Oral Daily   midazolam  2 mg Intravenous Once   multivitamin with minerals  1 tablet Oral Daily   thiamine  100 mg Oral Daily   Or   thiamine  100 mg Intravenous Daily   Continuous Infusions: PRN Meds: acetaminophen,  labetalol, LORazepam **OR** LORazepam  Time spent: 50 minutes  Author: Gillis Santa. MD [facility] 06/18/2022 3:08 PM  To reach On-call, see care teams to locate the attending and reach out to them via [email or web address]. If 7PM-7AM, please contact night-coverage If you still have difficulty reaching the attending provider, please page the [facility] (Director on Call) for [facility] on amion for assistance.

## 2022-06-18 NOTE — Telephone Encounter (Signed)
Pharmacy Patient Advocate Encounter  Insurance verification completed.    The patient is insured through [employer] Medicare Part D   The patient is currently admitted and ran test claims for the following: Jardiance.  Copays and coinsurance results were relayed to Inpatient clinical team.  

## 2022-06-18 NOTE — Care Management Important Message (Signed)
Important Message  Patient Details  Name: Wesley Barry MRN: [id number] Date of Birth: [birth year]   Medicare Important Message Given:  N/A - LOS <3 / Initial given by admissions     [name] 06/18/2022, 12:07 PM

## 2022-06-18 NOTE — Progress Notes (Signed)
Rounding Note    Patient Name: Wesley Barry Date of Encounter: 06/18/2022  [facility] Cardiologist: Julien Nordmann, MD   Subjective   Family at the bedside Considerable bedside weakness arm and leg Moderate dysphagia, able to drink thickened liquids Denies chest pain concerning for angina Discussed moderately depressed ejection fraction with family, EF 40 to 45% Started on goal-directed medical therapy  Inpatient Medications    Scheduled Meds:   stroke: early stages of recovery book   Does not apply Once   aspirin EC  81 mg Oral Daily   atorvastatin  80 mg Oral QHS   clopidogrel  75 mg Oral Daily   enoxaparin (LOVENOX) injection  40 mg Subcutaneous QPM   feeding supplement (NEPRO CARB STEADY)  237 mL Oral TID BM   folic acid  1 mg Oral Daily   insulin aspart  0-15 Units Subcutaneous Q4H   insulin glargine-yfgn  10 Units Subcutaneous QHS   [START ON 06/19/2022] losartan  12.5 mg Oral Daily   [START ON 06/20/2022] metoprolol succinate  25 mg Oral Daily   midazolam  2 mg Intravenous Once   multivitamin with minerals  1 tablet Oral Daily   thiamine  100 mg Oral Daily   Or   thiamine  100 mg Intravenous Daily   Continuous Infusions:  PRN Meds: acetaminophen, labetalol, LORazepam **OR** LORazepam   Vital Signs    Vitals:   06/18/22 0440 06/18/22 0500 06/18/22 0800 06/18/22 1140  BP: 125/75  115/81 105/82  Pulse: 92  78 92  Resp: 18  15 15   Temp: 97.9 F (36.6 C)  97.7 F (36.5 C) 98.1 F (36.7 C)  TempSrc:      SpO2: 96%  96% 99%  Weight:  112.2 kg    Height:        Intake/Output Summary (Last 24 hours) at 06/18/2022 1443 Last data filed at 06/18/2022 1028 Gross per 24 hour  Intake 180 ml  Output 400 ml  Net -220 ml      06/18/2022    5:00 AM 06/16/2022    6:10 AM 06/11/2021    9:51 AM  Last 3 Weights  Weight (lbs) 247 lb 5.7 oz 260 lb 258 lb  Weight (kg) 112.2 kg 117.935 kg 117.028 kg      Telemetry    Normal sinus rhythm,  yesterday with short run of atrial tachycardia lasting several seconds- Personally Reviewed  ECG     - Personally Reviewed  Physical Exam   GEN: No acute distress.   Neck: No JVD Cardiac: RRR, no murmurs, rubs, or gallops.  Respiratory: Clear to auscultation bilaterally. GI: Soft, nontender, non-distended  MS: No edema; No deformity.,  Right-sided weakness arm and leg Neuro:  Nonfocal  Psych: Normal affect   Labs    High Sensitivity Troponin:  No results for input(s): "TROPONINIHS" in the last 720 hours.   Chemistry Recent Labs  Lab 06/16/22 0605 06/17/22 0712 06/18/22 0424  NA 134* 136 137  K 3.6 3.9 3.8  CL 98 100 101  CO2 24 25 26   GLUCOSE 332* 251* 184*  BUN 13 13 16   CREATININE 0.90 0.81 0.89  CALCIUM 8.8* 9.0 9.0  MG  --  2.2 2.7*  PROT 7.0  --   --   ALBUMIN 3.8  --   --   AST 35  --   --   ALT 47*  --   --   ALKPHOS 21*  --   --  BILITOT 0.9  --   --   GFRNONAA >60 >60 >60  ANIONGAP 12 11 10     Lipids  Recent Labs  Lab 06/17/22 0712  CHOL 289*  TRIG 153*  HDL 38*  LDLCALC 220*  CHOLHDL 7.6    Hematology Recent Labs  Lab 06/16/22 0605 06/18/22 0424  WBC 4.9 7.0  RBC 5.31 5.49  HGB 15.4 15.7  HCT 47.4 49.9  MCV 89.3 90.9  MCH 29.0 28.6  MCHC 32.5 31.5  RDW 12.2 12.4  PLT 202 221   Thyroid  Recent Labs  Lab 06/17/22 0712  TSH 1.557    BNPNo results for input(s): "BNP", "PROBNP" in the last 168 hours.  DDimer No results for input(s): "DDIMER" in the last 168 hours.   Radiology    No results found.  Cardiac Studies   Echocardiogram yesterday showed normal LV size with severe LVH. LVEF 40-45% with grade 1 diastolic dysfunction. Normal RV size and function. Mild aortic regurgitation. Telemetry shows sinus rhythm.   Patient Profile     CHAVIS ARVELO is a 67 y.o. male with a hx of significant for prior smoker 50-pack-year history, obesity, shingles, alcohol use who is being seen 06/17/2022 for the evaluation of a newfound  cardiomyopathy on echocardiogram   Assessment & Plan    Acute stroke Acute left basal ganglia infarct, outside window for TNK Risk factors include history of smoking, uncontrolled diabetes, hyperlipidemia Started on aspirin Plavix 3 months followed by aspirin alone, high intensity statin Lipitor 80 daily Plan for Zio monitor.  We can send to his son's house Amie Critchley who could apply it to the patient (rather than sending it to nursing home)  Cardiomyopathy Incidental finding on echocardiogram, EF 40 to 45% with global hypokinesis Notes indicating history of alcohol abuse Tolerating metoprolol succinate 25, lisinopril 5 Will transition the lisinopril to losartan 12.5 daily in hopes of transitioning to [facility] at a later date Consideration of SGLT2 inhibitor especially in light of poorly controlled diabetes  Uncontrolled diabetes type 2 A1c 11.5, on insulin  Alcohol abuse Reports drinking 12 beers daily CIWA protocol, thiamine, folate, multivitamin  Hyperlipidemia LDL 220, started on Lipitor 80   Long discussion with family at bedside  Total encounter time more than 50 minutes  Greater than 50% was spent in counseling and coordination of care with the patient    For questions or updates, please contact [facility] Please consult [email or web address] for contact info under        Signed, Julien Nordmann, MD  06/18/2022, 2:43 PM

## 2022-06-19 ENCOUNTER — Ambulatory Visit: Payer: Medicare HMO | Attending: Physician Assistant

## 2022-06-19 ENCOUNTER — Telehealth: Payer: Self-pay | Admitting: Physician Assistant

## 2022-06-19 ENCOUNTER — Other Ambulatory Visit (HOSPITAL_COMMUNITY): Payer: Self-pay

## 2022-06-19 DIAGNOSIS — I639 Cerebral infarction, unspecified: Secondary | ICD-10-CM | POA: Diagnosis not present

## 2022-06-19 DIAGNOSIS — E1165 Type 2 diabetes mellitus with hyperglycemia: Secondary | ICD-10-CM | POA: Diagnosis not present

## 2022-06-19 DIAGNOSIS — I498 Other specified cardiac arrhythmias: Secondary | ICD-10-CM

## 2022-06-19 DIAGNOSIS — R531 Weakness: Secondary | ICD-10-CM | POA: Diagnosis not present

## 2022-06-19 DIAGNOSIS — F1010 Alcohol abuse, uncomplicated: Secondary | ICD-10-CM | POA: Diagnosis not present

## 2022-06-19 LAB — MAGNESIUM: Magnesium: 2.6 mg/dL — ABNORMAL HIGH (ref 1.7–2.4)

## 2022-06-19 LAB — BASIC METABOLIC PANEL
Anion gap: 9 (ref 5–15)
BUN: 18 mg/dL (ref 8–23)
CO2: 27 mmol/L (ref 22–32)
Calcium: 8.8 mg/dL — ABNORMAL LOW (ref 8.9–10.3)
Chloride: 102 mmol/L (ref 98–111)
Creatinine, Ser: 0.74 mg/dL (ref 0.61–1.24)
GFR, Estimated: >60 mL/min (ref >60)
Glucose, Bld: 175 mg/dL — ABNORMAL HIGH (ref 70–99)
Potassium: 3.5 mmol/L (ref 3.5–5.1)
Sodium: 138 mmol/L (ref 135–145)

## 2022-06-19 LAB — CBC
HCT: 50.5 % (ref 39.0–52.0)
Hemoglobin: 16.1 g/dL (ref 13.0–17.0)
MCH: 28.9 pg (ref 26.0–34.0)
MCHC: 31.9 g/dL (ref 30.0–36.0)
MCV: 90.7 fL (ref 80.0–100.0)
Platelets: 211 10*3/uL (ref 150–400)
RBC: 5.57 MIL/uL (ref 4.22–5.81)
RDW: 12.3 % (ref 11.5–15.5)
WBC: 7.7 10*3/uL (ref 4.0–10.5)
nRBC: 0 % (ref 0.0–0.2)

## 2022-06-19 LAB — GLUCOSE, CAPILLARY
Glucose-Capillary: 173 mg/dL — ABNORMAL HIGH (ref 70–99)
Glucose-Capillary: 178 mg/dL — ABNORMAL HIGH (ref 70–99)
Glucose-Capillary: 229 mg/dL — ABNORMAL HIGH (ref 70–99)
Glucose-Capillary: 212 mg/dL — ABNORMAL HIGH (ref 70–99)
Glucose-Capillary: 209 mg/dL — ABNORMAL HIGH (ref 70–99)

## 2022-06-19 LAB — PHOSPHORUS: Phosphorus: 5.2 mg/dL — ABNORMAL HIGH (ref 2.5–4.6)

## 2022-06-19 MED ORDER — CLOPIDOGREL BISULFATE 75 MG PO TABS: 75.0000 mg | ORAL_TABLET | Freq: Every day | ORAL | 3 refills | Status: DC

## 2022-06-19 MED ORDER — METOPROLOL SUCCINATE ER 50 MG PO TB24: 50.0000 mg | ORAL_TABLET | Freq: Every day | ORAL | 3 refills | Status: DC

## 2022-06-19 MED ORDER — ASPIRIN 81 MG PO TBEC: 81.0000 mg | DELAYED_RELEASE_TABLET | Freq: Every day | ORAL | 3 refills | Status: DC

## 2022-06-19 MED ORDER — FOLIC ACID 1 MG PO TABS: 1.0000 mg | ORAL_TABLET | Freq: Every day | ORAL | 0 refills | Status: AC

## 2022-06-19 MED ORDER — FOLIC ACID 1 MG PO TABS: 1.0000 mg | ORAL_TABLET | Freq: Every day | ORAL | 0 refills | Status: DC

## 2022-06-19 MED ORDER — SACUBITRIL-VALSARTAN 24-26 MG PO TABS: 1.0000 | ORAL_TABLET | Freq: Two times a day (BID) | ORAL | Status: DC

## 2022-06-19 MED ORDER — DAPAGLIFLOZIN PROPANEDIOL 10 MG PO TABS
10.0000 mg | ORAL_TABLET | Freq: Every day | ORAL | Status: DC
  Filled 2022-06-19: qty 1

## 2022-06-19 MED ORDER — ASPIRIN 81 MG PO TBEC: 81.0000 mg | DELAYED_RELEASE_TABLET | Freq: Every day | ORAL | 3 refills | Status: AC

## 2022-06-19 MED ORDER — METOPROLOL SUCCINATE ER 50 MG PO TB24: 50.0000 mg | ORAL_TABLET | Freq: Every day | ORAL | Status: DC

## 2022-06-19 MED ORDER — SACUBITRIL-VALSARTAN 24-26 MG PO TABS: 1.0000 | ORAL_TABLET | Freq: Two times a day (BID) | ORAL | 3 refills | Status: DC

## 2022-06-19 MED ORDER — INSULIN GLARGINE 100 UNIT/ML ~~LOC~~ SOLN: 15.0000 [IU] | Freq: Every day | SUBCUTANEOUS | 3 refills | Status: DC

## 2022-06-19 MED ORDER — ATORVASTATIN CALCIUM 80 MG PO TABS: 80.0000 mg | ORAL_TABLET | Freq: Every day | ORAL | 3 refills | Status: DC

## 2022-06-19 MED ORDER — METOPROLOL SUCCINATE ER 50 MG PO TB24: 50.0000 mg | ORAL_TABLET | Freq: Every day | ORAL | 3 refills | Status: AC

## 2022-06-19 MED ORDER — EMPAGLIFLOZIN 10 MG PO TABS: 10.0000 mg | ORAL_TABLET | Freq: Every day | ORAL | 3 refills | Status: DC

## 2022-06-19 MED ORDER — EMPAGLIFLOZIN 10 MG PO TABS
10.0000 mg | ORAL_TABLET | Freq: Every day | ORAL | Status: DC
  Administered 2022-06-19: 10 mg via ORAL
  Filled 2022-06-19: qty 1

## 2022-06-19 NOTE — Addendum Note (Signed)
Addended by: Bryna Colander on: 06/19/2022 03:34 PM   Modules accepted: Orders

## 2022-06-19 NOTE — TOC Transition Note (Signed)
Transition of Care [facility]) - CM/SW Discharge Note   Patient Details  Name: Wesley Barry MRN: [id number] Date of Birth: 12-03-1955  [facility] CM/SW Contact:  Margarito Liner, LCSW Phone Number: 06/19/2022, 3:33 PM   Clinical Narrative:   Patient has orders to discharge to [facility] SNF today. RN will call report to [phone] (Room 4A). EMS transport has been arranged and he is 5th on the list. No further concerns. CSW signing off.  Final next level of care: Skilled Nursing Facility Barriers to Discharge: Barriers Resolved   Patient Goals and CMS Choice CMS Medicare.gov Compare Post Acute Care list provided to:: Patient Represenative (must comment) (son) Choice offered to / list presented to : Adult Children  Discharge Placement PASRR number recieved: 06/19/22 PASRR number recieved: 06/19/22            Patient chooses bed at: [facility] Patient to be transferred to facility by: EMS Name of family member notified: Amie Critchley Patient and family notified of of transfer: 06/19/22  Discharge Plan and Services Additional resources added to the After Visit Summary for                                       Social Determinants of Health (SDOH) Interventions SDOH Screenings   Food Insecurity: No Food Insecurity (06/17/2022)  Housing: Low Risk  (06/17/2022)  Transportation Needs: No Transportation Needs (06/17/2022)  Utilities: Not At Risk (06/17/2022)  Tobacco Use: Medium Risk (06/17/2022)     Readmission Risk Interventions     No data to display

## 2022-06-19 NOTE — Progress Notes (Signed)
Speech Language Pathology Treatment: Dysphagia;Cognitive-Linquistic  Patient Details Name: Wesley Barry MRN: [id number] DOB: 06/09/1955 Today's Date: 06/19/2022 Time: 4098-1191 SLP Time Calculation (min) (ACUTE ONLY): 75 min  Assessment / Plan / Recommendation Clinical Impression  Pt seen for ongoing assessment of swallowing w/ trials to upgrade diet consistency and Dysarthria tx today. Pt is exhibiting min improvements in his speech(significant Dysarthria s/p this CVA) and in his swallowing - tolerating the upgrade to Minced foods since last tx session.  Pt awake, verbal -- he followed verbal cues to Slow Down and Over-articulate to increase intelligibility of speech. He fed himself w/ setup support. Family present.  On [place] O2 support 1-2L; afebrile, WBC wnl.    Pt educated on strict aspiration precautions including small, single sips then fed self(holding cup for improved swallowing safety) trials of thin liquids(15+ of water, diet coke) VIA CUP ONLY. No overt clinicial s/s of aspiration noted; No decline in respiratory status nor vocal quality b/t trials. Pt was able to maintain head forward position w/out need for head turn R during swallowing this session. Swallows were prompt w/ the majority of trials; laryngeal excursion wnl. Pt utilized a DRY swallow to aid clearing of any oropharyngeal residue -- he has been noted to have a hacking cough much post the po trials which is suspected to be d/t potential residue not fully cleared. This was noted x2 during trials of thin liquids -- a hack cough ~1 min post the trials. Oral phase c/b improved bolus management w/ no anterior leakage, full lingual sweeping on R side of cheek/buccal area to ensure oral clearing.  Pt continues to present w/ decreased R oral weakness/sensation. Suspect decreased oropharyngeal sensation and lingual weakness hampering complete clearing which could then increase risk for penetration/aspiration POST the swallow. MUCH  education was given on the importance of head forward position, lingual sweep post initial swallow, and DRY swallow post initial swallow. Pt was able to follow through w/ these instructions/strategies appropriately w/ cue.    Recommend upgrade to Dysphagia level 2 (MINCED) foods w/ Thin liquids via Cup ONLY; aspiration precautions. Time b/t trials for lingual sweeping and DRY swallowing. Feeding support/setup and Supervision at meals for follow through w/ precautions/strategies. Pills Crushed vs Whole in puree for safety. Reduce Distractions/Talking during meals and check for oral clearing during/post intake as needed. NSG/MD updated.  ST services will f/u w/ ongoing toleration of diet w/ trials to upgrade foods in diet as safe, appropriate; MBSS if indicated moving forward. Pt/Son agreed. MD/NSG updated.  Pt seen for ongoing tx of Dysarthria; education on speech exs and strategies. He continues to present w/ at least Moderate Dysarthria w/ R lingual/labial/oral weakness c/b imprecise articulation and irregular speech rate -- possibly min Apraxia(?). Pt is stimuable for use of strategies to improve speech intelligibility including: over-articulation, slowing rate, increasing volume. Pt and SLP practiced speech Repetition tasks using the strategies w/ much improved intelligibility at the word-short phrase level. He demonstrated functional awareness of the Dysarthria and need for communication repair to improve understanding during conversation. Homework given to pt/Family included using automatic speech tasks and repeating Menu items using the articulatory strategies as practiced today. Pt agreed. Handout given. Suspect potential Mild deficits noted in more complex Cognitive-linguistic tasks including problem solving and attention, with pt benefiting from extended time and redirection for completion of verbal tasks. Auditory comprehension, orientation, and visual processing grossly intact. Per pt/Son reported  limited literacy at baseline. Further cognitive linguistic assessment is warranted at next venue of  care to address independence w/ ADLs and safety in the home. Pt/Son agreed. MD/NSG updated.        HPI HPI: Pt is a 67 y.o. male with medical history significant of Alcohol use, Obesity, remote history of tobacco use, history of shingles presenting with new CVA.  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      SLP Plan  Continue with current plan of care      Recommendations for follow up therapy are one component of a multi-disciplinary discharge planning process, led by the attending physician.  Recommendations may be updated based on patient status, additional functional criteria and insurance authorization.    Recommendations  Diet recommendations: Dysphagia 2 (fine chop);Thin liquid Liquids provided via: Cup;No straw Medication Administration: Whole meds with puree (vs CRUSHED in Puree) Supervision: Patient able to self feed;Staff to assist with self feeding;Intermittent supervision to cue for compensatory strategies (RUE weakness) Compensations: Minimize environmental distractions;Slow rate;Small sips/bites;Lingual sweep for clearance of pocketing;Multiple dry swallows after each bite/sip;Follow solids with liquid Postural Changes and/or Swallow Maneuvers: Out of bed for meals;Seated upright 90 degrees;Upright 30-60 min after meal (REFLUX precs)                 (next venue of care f/u) Oral care BID;Oral care before and after PO;Staff/trained caregiver to provide oral care (support)   Frequent or constant Supervision/Assistance Dysphagia, oropharyngeal phase (R13.12);Dysarthria and anarthria (R47.1);Cognitive communication deficit (R41.841)     Continue with current plan of care       Jerilynn Som, MS,  CCC-SLP Speech Language Pathologist Rehab Services; [facility] [phone] (ascom) Cenia Zaragosa  06/19/2022, 3:56 PM

## 2022-06-19 NOTE — Discharge Summary (Addendum)
Physician Discharge Summary   Patient: Wesley Barry MRN: [id number] DOB: May 23, 1955  Admit date:     06/16/2022  Discharge date: 06/19/22  Discharge Physician: Loyce Dys   PCP: Patient, No Pcp Per   Recommendations at discharge:  Fluid restriction to 50 ounces per day  Follow-up with cardiologist  Outpatient neurology follow-up-[facility] stroke [facility] NP in 8 to 12 weeks or at stroke clinic of his choice as desired by the patient/family.  Follow-up with PCP in 2 to 4 weeks after discharge for management of hyperlipidemia and diabetes amongst other comorbidities and to get close follow-up.  Discharge Diagnoses: Acute ischemic CVA Patient presented with right-sided facial droop and right-sided weakness Last known normal 9 PM on 06/16/2022, out of the window for TNK Acute systolic CHF New onset diabetes mellitus type 2 EtOH use disorder, abstinence counseling done.   Hospital Course: Wesley Barry is a 67 y.o. male with medical history significant of alcohol use, obesity, remote history of tobacco use, history of shingles presenting with CVA.  Patient presented to the ER with noted new onset right-sided weakness.  Patient does not follow with PCP. ED workup Code stroke CT head grossly stable. CTA head and neck negative for any large vessel occlusion but does show age-indeterminate occlusion of the nondominant atherosclerotic left vertebral artery V4 segment which is favored to be chronic. Moderate left MCA M1 and bilateral PCA P2 segment stenosis  Teleneuro was consulted, recommended aspirin and Plavix loading and a stroke workup. MRI Brain: Acute perforator infarct in the left basal ganglia.  TTE LVEF 40 to 45%, systolic function decreased and global hypokinesia.  Grade 1 diastolic dysfunction.  No significant valvular abnormality, negative PFO.  Cardiologist was consulted.  Patient initiated guideline directed medical therapy as patient was found to be euvolemic and was not in any  acute heart failure exacerbation.  Patient was also found to have new onset diabetes mellitus with a hemoglobin A1c of 11.5. Recommended insulin therapy A1c improved to 7.diabetes #3 May consider oral antidiabetic agents. Neurology recommended DAPT for 90 days followed by aspirin only.  Patient has been cleared by neurology as well as cardiologist for discharge today and patient will have to follow-up with an outpatient cardiologist, neurologist as well as primary care physician as shown above.   Consultants: Neurology, cardiology Procedures performed: None Disposition: Skilled nursing facility Diet recommendation:  Discharge Diet Orders (From admission, onward)     Start     Ordered   06/19/22 0000  Diet - low sodium heart healthy        06/19/22 1437            Diet:  Dysphagia level 2(Minced foods w/ gravies) w/ Thin liquids via CUP ONLY; strict aspiration precautions. Pills in PUREE. Assistance at meals d/t RUE weakness.       DISCHARGE MEDICATION: Allergies as of 06/19/2022       Reactions   Tylenol With Codeine #3 [acetaminophen-codeine]         Medication List     TAKE these medications    aspirin EC 81 MG tablet Take 1 tablet (81 mg total) by mouth daily. Swallow whole. Start taking on: Jun 20, 2022   atorvastatin 80 MG tablet Commonly known as: LIPITOR Take 1 tablet (80 mg total) by mouth at bedtime.   clopidogrel 75 MG tablet Commonly known as: PLAVIX Take 1 tablet (75 mg total) by mouth daily. Start taking on: Jun 20, 2022   empagliflozin 10 MG  Tabs tablet Commonly known as: JARDIANCE Take 1 tablet (10 mg total) by mouth daily. Start taking on: Jun 20, 2022   folic acid 1 MG tablet Commonly known as: FOLVITE Take 1 tablet (1 mg total) by mouth daily. Start taking on: Jun 20, 2022   insulin glargine 100 UNIT/ML injection Commonly known as: LANTUS Inject 0.15 mLs (15 Units total) into the skin at bedtime.   metoprolol succinate 50 MG 24 hr  tablet Commonly known as: TOPROL-XL Take 1 tablet (50 mg total) by mouth daily. Take with or immediately following a meal. Start taking on: Jun 20, 2022   sacubitril-valsartan 24-26 MG Commonly known as: ENTRESTO Take 1 tablet by mouth 2 (two) times daily. Start taking on: Jun 20, 2022        Contact information for follow-up providers     Gollan, Tollie Pizza, MD Follow up.   Specialty: Cardiology Contact information: [address] [place] [address] [phone]              Contact information for after-discharge care     Destination     [facility] Preferred SNF .   Service: Skilled Nursing Contact information: [address] [place] [address] [phone]                    Discharge Exam: [name] Weights   06/16/22 0610 06/18/22 0500  Weight: 117.9 kg 112.2 kg   General: NAD, lying comfortably Appear in no distress, affect appropriate Eyes: PERRLA ENT: Oral Mucosa Clear, moist  Neck: no JVD,  Cardiovascular: S1 and S2 Present, no Murmur,  Respiratory: good respiratory effort, Bilateral Air entry equal and Decreased, no Crackles, no wheezes Abdomen: Bowel Sound present, Soft and no tenderness,  Skin: no rashes Extremities: no Pedal edema, no calf tenderness Neurologic: Right facial droop and right hemiparesis, power 1/5 RLE and flaccid paralysis of right arm. Gait not checked due to patient safety concerns  Condition at discharge: good  The results of significant diagnostics from this hospitalization (including imaging, microbiology, ancillary and laboratory) are listed below for reference.   Imaging Studies: ECHOCARDIOGRAM COMPLETE  Result Date: 06/16/2022    ECHOCARDIOGRAM REPORT   Patient Name:   Wesley Barry [facility] Date of Exam: 06/16/2022 Medical Rec #:  [id number]         Height:       69.0 in Accession #:    [id number]        Weight:       260.0 lb Date of Birth:  [birth year]          BSA:           2.310 m Patient Age:    67 years          BP:           156/112 mmHg Patient Gender: M                 HR:           88 bpm. Exam Location:  [facility] Procedure: 2D Echo, Cardiac Doppler and Color Doppler Indications:     TIA G 45.9  History:         Patient has no prior history of Echocardiogram examinations.                  Risk Factors:Hypertension. Alcohol abuse , CVA.  Sonographer:     Cristela Blue Referring Phys:  [phone] Floydene Flock  Diagnosing Phys: Yvonne Kendall MD  Sonographer Comments: Suboptimal apical window and no subcostal window. IMPRESSIONS  1. Left ventricular ejection fraction, by estimation, is 40 to 45%. The left ventricle has mildly decreased function. The left ventricle demonstrates global hypokinesis. There is severe left ventricular hypertrophy. Left ventricular diastolic parameters  are consistent with Grade I diastolic dysfunction (impaired relaxation).  2. Right ventricular systolic function is normal. The right ventricular size is normal.  3. The mitral valve is grossly normal. No evidence of mitral valve regurgitation. No evidence of mitral stenosis.  4. The aortic valve was not well visualized. Aortic valve regurgitation is mild. No aortic stenosis is present. FINDINGS  Left Ventricle: Left ventricular ejection fraction, by estimation, is 40 to 45%. The left ventricle has mildly decreased function. The left ventricle demonstrates global hypokinesis. The left ventricular internal cavity size was normal in size. There is  severe left ventricular hypertrophy. Left ventricular diastolic parameters are consistent with Grade I diastolic dysfunction (impaired relaxation). Right Ventricle: The right ventricular size is normal. Right vetricular wall thickness was not well visualized. Right ventricular systolic function is normal. Left Atrium: Left atrial size was normal in size. Right Atrium: Right atrial size was normal in size. Pericardium: The pericardium was not well visualized. Presence of  epicardial fat layer. Mitral Valve: The mitral valve is grossly normal. No evidence of mitral valve regurgitation. No evidence of mitral valve stenosis. MV peak gradient, 2.3 mmHg. The mean mitral valve gradient is 1.0 mmHg. Tricuspid Valve: The tricuspid valve is not well visualized. Tricuspid valve regurgitation is trivial. Aortic Valve: The aortic valve was not well visualized. Aortic valve regurgitation is mild. No aortic stenosis is present. Aortic valve mean gradient measures 2.5 mmHg. Aortic valve peak gradient measures 3.8 mmHg. Aortic valve area, by VTI measures 5.31  cm. Pulmonic Valve: The pulmonic valve was not well visualized. Pulmonic valve regurgitation is not visualized. No evidence of pulmonic stenosis. Aorta: The aortic root is normal in size and structure. Pulmonary Artery: The pulmonary artery is of normal size. IAS/Shunts: The interatrial septum was not well visualized.  LEFT VENTRICLE PLAX 2D LVIDd:         3.30 cm   Diastology LVIDs:         2.55 cm   LV e' medial:    4.24 cm/s LV PW:         1.60 cm   LV E/e' medial:  10.5 LV IVS:        1.70 cm   LV e' lateral:   6.53 cm/s LVOT diam:     2.30 cm   LV E/e' lateral: 6.8 LV SV:         70 LV SV Index:   30 LVOT Area:     4.15 cm  RIGHT VENTRICLE RV Basal diam:  3.00 cm RV Mid diam:    3.00 cm RV S prime:     11.20 cm/s TAPSE (M-mode): 3.0 cm LEFT ATRIUM             Index        RIGHT ATRIUM           Index LA diam:        2.65 cm 1.15 cm/m   RA Area:     15.90 cm LA Vol (A2C):   40.6 ml 17.58 ml/m  RA Volume:   40.90 ml  17.71 ml/m LA Vol (A4C):   19.8 ml 8.57 ml/m LA Biplane Vol: 30.9 ml 13.38 ml/m  AORTIC VALVE AV Area (Vmax):    4.09 cm AV Area (Vmean):   3.73 cm AV Area (VTI):     5.31 cm AV Vmax:           97.30 cm/s AV Vmean:          70.950 cm/s AV VTI:            0.132 m AV Peak Grad:      3.8 mmHg AV Mean Grad:      2.5 mmHg LVOT Vmax:         95.80 cm/s LVOT Vmean:        63.700 cm/s LVOT VTI:          0.168 m LVOT/AV VTI  ratio: 1.28  AORTA Ao Root diam: 3.60 cm MITRAL VALVE               TRICUSPID VALVE MV Area (PHT): 4.36 cm    TR Peak grad:   11.8 mmHg MV Area VTI:   3.94 cm    TR Vmax:        172.00 cm/s MV Peak grad:  2.3 mmHg MV Mean grad:  1.0 mmHg    SHUNTS MV Vmax:       0.76 m/s    Systemic VTI:  0.17 m MV Vmean:      49.8 cm/s   Systemic Diam: 2.30 cm MV Decel Time: 174 msec MV E velocity: 44.50 cm/s MV A velocity: 71.80 cm/s MV E/A ratio:  0.62 Cristal Deer End MD Electronically signed by Yvonne Kendall MD Signature Date/Time: 06/16/2022/6:34:52 PM    Final    CT HEAD WO CONTRAST ( )  Result Date: 06/16/2022 CLINICAL DATA:  Stroke, follow up EXAM: CT HEAD WITHOUT CONTRAST TECHNIQUE: Contiguous axial images were obtained from the base of the skull through the vertex without intravenous contrast. RADIATION DOSE REDUCTION: This exam was performed according to the departmental dose-optimization program which includes automated exposure control, adjustment of the mA and/or kV according to patient size and/or use of iterative reconstruction technique. COMPARISON:  CT 06/16/2022 at 0541 hours, MRI 06/16/2022 FINDINGS: Brain: Subtle low-density changes in the left basal ganglia compatible with acute infarct as seen on recent MRI. No hemorrhagic transformation. No evidence of new large territory infarction. No hydrocephalus. No mass lesion or mass effect. Vascular: Atherosclerotic calcifications involving the large vessels of the skull base. No unexpected hyperdense vessel. Skull: Normal. Negative for fracture or focal lesion. Sinuses/Orbits: No acute finding. Other: None. IMPRESSION: Subtle low-density changes in the left basal ganglia compatible with acute infarct as seen on recent MRI. No hemorrhagic transformation. Electronically Signed   By: Duanne Guess D.O.   On: 06/16/2022 10:24   MR BRAIN WO CONTRAST  Result Date: 06/16/2022 CLINICAL DATA:  right sided weakness. EXAM: MRI HEAD WITHOUT CONTRAST TECHNIQUE:  Multiplanar, multiecho pulse sequences of the brain and surrounding structures were obtained without intravenous contrast. COMPARISON:  CT head from the same day. 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 Slight edema without mass effect. No evidence of acute hemorrhage, mass lesion, midline shift or hydrocephalus. Scattered T2/FLAIR hyperintensities in the white matter, nonspecific but compatible with chronic microvascular ischemic changes. Vascular: Major arterial flow voids are maintained at the skull base. Skull and upper cervical spine: Normal marrow signal. Sinuses/Orbits: Largely clear sinuses.  No acute orbital findings. Other: No mastoid effusions. 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 Electronically Signed   By: Juluis Mire.D.  On: 06/16/2022 09:24   CT HEAD CODE STROKE WO CONTRAST  Addendum Date: 06/16/2022   ADDENDUM REPORT: 06/16/2022 06:17 ADDENDUM: Study discussed by telephone with Dr. Delton Prairie on 06/16/2022 0547 hours today. Electronically Signed   By: Odessa Fleming M.D.   On: 06/16/2022 06:17   Result Date: 06/16/2022 CLINICAL DATA:  Code stroke. 67 year old male last known well 2100 hours. EXAM: CT HEAD WITHOUT CONTRAST TECHNIQUE: Contiguous axial images were obtained from the base of the skull through the vertex without intravenous contrast. RADIATION DOSE REDUCTION: This exam was performed according to the departmental dose-optimization program which includes automated exposure control, adjustment of the mA and/or kV according to patient size and/or use of iterative reconstruction technique. COMPARISON:  None Available. FINDINGS: Brain: Cerebral volume is within normal limits for age. No midline shift, mass effect, or evidence of intracranial mass lesion. No ventriculomegaly. No acute intracranial hemorrhage identified. Patchy mild bilateral cerebral white matter hypodensity. Perivascular space left inferior lentiform (normal  variant). But mild heterogeneity also in the bilateral thalami and brainstem. No cortically based acute infarct identified. Vascular: Extensive Calcified atherosclerosis at the skull base. No suspicious intracranial vascular hyperdensity. Skull: No acute osseous abnormality identified. Sinuses/Orbits: Mild paranasal sinus mucosal thickening, primarily in the maxillary sinuses. Tympanic cavities and mastoids are clear. Other: No gaze deviation, negative orbit and scalp soft tissues. ASPECTS [facility] Stroke Program Early CT Score) Total score (0-10 with 10 being normal): 10 IMPRESSION: 1. No acute cortically based infarct or acute intracranial hemorrhage identified. ASPECTS 10. 2. But evidence of some age indeterminate small vessel disease in both hemispheres. Electronically Signed: By: Odessa Fleming M.D. On: 06/16/2022 05:43   CT ANGIO HEAD NECK W WO CM (CODE STROKE)  Result Date: 06/16/2022 CLINICAL DATA:  67 year old male with unilateral weakness, facial asymmetry since 2100 hours. EXAM: CT ANGIOGRAPHY HEAD AND NECK TECHNIQUE: Multidetector CT imaging of the head and neck was performed using the standard protocol during bolus administration of intravenous contrast. Multiplanar CT image reconstructions and MIPs were obtained to evaluate the vascular anatomy. Carotid stenosis measurements (when applicable) are obtained utilizing NASCET criteria, using the distal internal carotid diameter as the denominator. RADIATION DOSE REDUCTION: This exam was performed according to the departmental dose-optimization program which includes automated exposure control, adjustment of the mA and/or kV according to patient size and/or use of iterative reconstruction technique. CONTRAST:  OMNIPAQUE IOHEXOL 350 MG/ML SOLN COMPARISON:  Plain head CT today 0540 hours. CT Perfusion at the same time reported separately. FINDINGS: CTA NECK Skeleton: Carious dentition. Cervical spine degeneration with degenerative C2-C3 facet ankylosis on  the left. No acute osseous abnormality identified. Upper chest: Confluent but sub solid right upper lobe peribronchial opacity on both series 6, images 169 and 172. This has an infectious/inflammatory appearance. Visible left lung appears negative. Mediastinal lipomatosis. Visible central pulmonary arteries appear patent. No mediastinal lymphadenopathy. Other neck: No acute finding. Aortic arch: 3 vessel arch.  Mild Calcified aortic atherosclerosis. Right carotid system: Mildly tortuous brachiocephalic artery and right CCA origin with mild plaque and no significant stenosis. More moderate soft and calcified plaque of the right CCA at the level of the larynx (series 6, image 134) but less than 50 % stenosis with respect to the distal vessel before the right carotid bifurcation. At the bifurcation there is calcified atherosclerosis of the right ICA origin and bulb but less than 50 % stenosis with respect to the distal vessel. Right ICA remains patent to the skull base. Left carotid system: Mildly  tortuous left CCA with only mild plaque before the carotid bifurcation. Calcified left ICA origin and bulb with less than 50 % stenosis with respect to the distal vessel. Vertebral arteries: Normal proximal right subclavian artery. Faint calcified plaque at the right vertebral artery origin without stenosis. Dominant appearing right vertebral artery is patent to the skull base with no additional plaque or stenosis. Proximal left subclavian artery mild tortuosity and plaque without stenosis. Normal left vertebral artery origin on series 8, image 283. Non dominant left vertebral artery is mildly irregular throughout the neck but patent to the skull base with no significant stenosis identified. CTA HEAD Posterior circulation: Dominant right vertebral artery is patent and functionally supplies the basilar. Right PICA origin is normal. Right V4 calcified plaque distal to the PICA is prominent but results in less than 50 % stenosis  with respect to the distal vessel. Contralateral non dominant left vertebral artery is occluded in the distal V4 segment, in an area of calcified plaque (series 9, image 118). Left PICA origin remains patent. Basilar artery is patent with mild proximal 3rd basilar irregularity and mild stenosis. Basilar tip, SCA and PCA origins are patent. Posterior communicating arteries are diminutive or absent. Mild to moderate irregularity and stenosis of the left PCA P2 segment on series 13 image 25. And similar right PCA proximal P2 segment appearance on image 17. Bilateral distal PCA branches remain patent. Anterior circulation: Both ICA siphons are patent. Left siphon calcified plaque is moderate to severe in the supraclinoid segment with associated moderate to severe supraclinoid stenosis on series 9, image 84. Similar contralateral right ICA supraclinoid segment predominant calcified plaque, moderate to severe supraclinoid stenosis on series 9, image 83. But there is also conspicuous right petrous ICA soft and calcified plaque resulting in mild stenosis. Patent carotid termini, MCA and ACA origins. Mildly tortuous A1 segments. Normal anterior communicating artery. Bilateral ACA branches are within normal limits. Left MCA M1 segment is patent with mild to moderate irregularity and stenosis on series 11, image 21. Patent left MCA bifurcation. No left MCA branch occlusion is identified but there is up to moderate multifocal M2 and M3 branch irregularity and stenosis on series 13, image 29. Contralateral right MCA M1 is patent with mild irregularity and stenosis. Patent right MCA bifurcation without stenosis. No right MCA branch occlusion identified, and comparatively mild right MCA branch irregularity. Venous sinuses: Early contrast timing, not evaluated. Anatomic variants: Dominant right vertebral artery. Review of the MIP images confirms the above findings IMPRESSION: 1. Negative for large vessel occlusion. 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 Legrand Rams this is chronic based on plain Head CT and CT Perfusion (reported separately). - other fairly Extensive Intracranial Atherosclerosis with - Severe bilateral supraclinoid ICA stenoses. - up to Moderate Left MCA M1 stenosis, bilateral PCA P2 segment stenoses. - and left > right mild to moderate MCA branch irregularity and stenosis. 2. Bilateral carotid bifurcation and right vertebral V4 segment calcified atherosclerosis without hemodynamically significant stenosis. Aortic Atherosclerosis (ICD10-I70.0). 3. Partially visible right upper lobe peribronchial opacity most compatible with Acute Bronchopneumonia. 4. Carious dentition. Study discussed by telephone with Dr. Delton Prairie on 06/16/2022 at 06:13 . Electronically Signed   By: Odessa Fleming M.D.   On: 06/16/2022 06:16   CT CEREBRAL PERFUSION W CONTRAST  Result Date: 06/16/2022 CLINICAL DATA:  67 year old male with unilateral weakness and facial asymmetry since 20/1 100 hours. EXAM: CT PERFUSION BRAIN TECHNIQUE: Multiphase CT  imaging of the brain was performed following IV bolus contrast injection. Subsequent parametric perfusion maps were calculated using RAPID software. RADIATION DOSE REDUCTION: This exam was performed according to the departmental dose-optimization program which includes automated exposure control, adjustment of the mA and/or kV according to patient size and/or use of iterative reconstruction technique. CONTRAST:  OMNIPAQUE IOHEXOL 350 MG/ML SOLN COMPARISON:  Plain head CT 0540 hours today. CTA head and neck reported separately. FINDINGS: ASPECTS: 10 CBF (<30%) Volume: 0mL Perfusion (Tmax>6.0s) volume: 0mL No CTP parameter abnormality detected. Mismatch Volume: Not applicable Infarction Location:Not applicable IMPRESSION: Negative CT Perfusion. Electronically Signed   By: Odessa Fleming M.D.   On: 06/16/2022 06:02    Microbiology: No  results found for this or any previous visit.  Labs: CBC: Recent Labs  Lab 06/16/22 0605 06/18/22 0424 06/19/22 0429  WBC 4.9 7.0 7.7  NEUTROABS 2.3  --   --   HGB 15.4 15.7 16.1  HCT 47.4 49.9 50.5  MCV 89.3 90.9 90.7  PLT 202 221 211   Basic Metabolic Panel: Recent Labs  Lab 06/16/22 0605 06/17/22 0712 06/18/22 0424 06/19/22 0429  NA 134* 136 137 138  K 3.6 3.9 3.8 3.5  CL 98 100 101 102  CO2 24 25 26 27   GLUCOSE 332* 251* 184* 175*  BUN 13 13 16 18   CREATININE 0.90 0.81 0.89 0.74  CALCIUM 8.8* 9.0 9.0 8.8*  MG  --  2.2 2.7* 2.6*  PHOS  --  4.2 5.6* 5.2*   Liver Function Tests: Recent Labs  Lab 06/16/22 0605  AST 35  ALT 47*  ALKPHOS 21*  BILITOT 0.9  PROT 7.0  ALBUMIN 3.8   CBG: Recent Labs  Lab 06/18/22 2114 06/18/22 2346 06/19/22 0357 06/19/22 0745 06/19/22 1146  GLUCAP 188* 166* 173* 178* 229*    Discharge time spent: greater than 30 minutes.  Signed: Loyce Dys, MD [facility] 06/19/2022

## 2022-06-19 NOTE — TOC Benefit Eligibility Note (Addendum)
Patient Product/process development scientist completed.    The patient is currently admitted and upon discharge could be taking Entresto.  The current 30 day co-pay is $45.00.     Patient Product/process development scientist completed.    The patient is currently admitted and upon discharge could be taking Jardiance.  The current 30 day co-pay is $45.00.     Patient Product/process development scientist completed.    The patient is currently admitted and upon discharge could be taking [place].  The current 30 day co-pay is $95.00.   The patient is insured through [employer]   This test claim was processed through [facility]- copay amounts may vary at other pharmacies due to pharmacy/plan contracts, or as the patient moves through the different stages of their insurance plan.

## 2022-06-19 NOTE — Progress Notes (Addendum)
Progress Note  Patient Name: Wesley Barry Date of Encounter: 06/19/2022  Primary Cardiologist: Mariah Milling  Subjective   Working with speech therapy.  No symptoms of angina or cardiac decompensation.  No evidence of significant arrhythmia on telemetry.  Inpatient Medications    Scheduled Meds:   stroke: early stages of recovery book   Does not apply Once   aspirin EC  81 mg Oral Daily   atorvastatin  80 mg Oral QHS   clopidogrel  75 mg Oral Daily   enoxaparin (LOVENOX) injection  40 mg Subcutaneous QPM   feeding supplement (NEPRO CARB STEADY)  237 mL Oral TID BM   folic acid  1 mg Oral Daily   insulin aspart  0-15 Units Subcutaneous Q4H   insulin glargine-yfgn  10 Units Subcutaneous QHS   losartan  12.5 mg Oral Daily   [START ON 06/20/2022] metoprolol succinate  25 mg Oral Daily   midazolam  2 mg Intravenous Once   multivitamin with minerals  1 tablet Oral Daily   thiamine  100 mg Oral Daily   Or   thiamine  100 mg Intravenous Daily   Continuous Infusions:  PRN Meds: acetaminophen, labetalol   Vital Signs    Vitals:   06/18/22 2105 06/18/22 2345 06/19/22 0356 06/19/22 1147  BP: 132/71 127/73 134/73 139/77  Pulse:  98 93 (!) 101  Resp: 17 18 16 18   Temp: 98.1 F (36.7 C) 98.3 F (36.8 C) 97.9 F (36.6 C) 98.5 F (36.9 C)  TempSrc: Oral     SpO2: 97% 96% 95% 95%  Weight:      Height:        Intake/Output Summary (Last 24 hours) at 06/19/2022 1238 Last data filed at 06/18/2022 2214 Gross per 24 hour  Intake --  Output 200 ml  Net -200 ml   Filed Weights   06/16/22 0610 06/18/22 0500  Weight: 117.9 kg 112.2 kg    Telemetry    SR - Personally Reviewed  ECG    No new tracings - Personally Reviewed  Physical Exam   GEN: No acute distress.   Neck: JVD difficult to assess secondary to body habitus and facial hair. Cardiac: RRR, no murmurs, rubs, or gallops.  Respiratory: Clear to auscultation bilaterally.  GI: Soft, nontender, non-distended.    MS: No edema; No deformity. Neuro:  Alert and oriented x 3.  Psych: Normal affect.  Labs    Chemistry Recent Labs  Lab 06/16/22 0605 06/17/22 0712 06/18/22 0424 06/19/22 0429  NA 134* 136 137 138  K 3.6 3.9 3.8 3.5  CL 98 100 101 102  CO2 24 25 26 27   GLUCOSE 332* 251* 184* 175*  BUN 13 13 16 18   CREATININE 0.90 0.81 0.89 0.74  CALCIUM 8.8* 9.0 9.0 8.8*  PROT 7.0  --   --   --   ALBUMIN 3.8  --   --   --   AST 35  --   --   --   ALT 47*  --   --   --   ALKPHOS 21*  --   --   --   BILITOT 0.9  --   --   --   GFRNONAA >60 >60 >60 >60  ANIONGAP 12 11 10 9      Hematology Recent Labs  Lab 06/16/22 0605 06/18/22 0424 06/19/22 0429  WBC 4.9 7.0 7.7  RBC 5.31 5.49 5.57  HGB 15.4 15.7 16.1  HCT 47.4 49.9 50.5  MCV  89.3 90.9 90.7  MCH 29.0 28.6 28.9  MCHC 32.5 31.5 31.9  RDW 12.2 12.4 12.3  PLT 202 221 211    Cardiac EnzymesNo results for input(s): "TROPONINI" in the last 168 hours. No results for input(s): "TROPIPOC" in the last 168 hours.   BNPNo results for input(s): "BNP", "PROBNP" in the last 168 hours.   DDimer No results for input(s): "DDIMER" in the last 168 hours.   Radiology    No results found.  Cardiac Studies   2D echo 06/16/2022: 1. Left ventricular ejection fraction, by estimation, is 40 to 45%. The  left ventricle has mildly decreased function. The left ventricle  demonstrates global hypokinesis. There is severe left ventricular  hypertrophy. Left ventricular diastolic parameters   are consistent with Grade I diastolic dysfunction (impaired relaxation).   2. Right ventricular systolic function is normal. The right ventricular  size is normal.   3. The mitral valve is grossly normal. No evidence of mitral valve  regurgitation. No evidence of mitral stenosis.   4. The aortic valve was not well visualized. Aortic valve regurgitation  is mild. No aortic stenosis is present.   Patient Profile     67 y.o. male with history of prior tobacco  use with a 50-pack-year history, obesity, shingles, diabetes, and alcohol use who is being evaluated for cardiomyopathy.  Assessment & Plan    1.  Acute CVA with residual left-sided weakness: -Acute left basal ganglia infarct, outside window for TNK -Multiple risk factors including smoking, uncontrolled diabetes, and HLD -Echo without significant structural abnormality -Monitor on telemetry -Our office will arrange outpatient Zio patch (will mail to son who will apply Zio patch to patient) -DAPT with aspirin and clopidogrel per neurology (they recommend 3 months of DAPT followed by aspirin monotherapy thereafter) -Atorvastatin 80 mg  2.  HFrEF: -Incidental finding on echo -Currently well compensated -Transition from losartan to Entresto 24/26 mg twice daily on 5/18 (he will have completed 36-hour washout of ACE inhibitor) -Titrate Toprol-XL to 50 mg daily -Add SGLT2 inhibitor -Would look to add MRA prior to discharge or in follow-up -Will need to pursue outpatient ischemic evaluation of cardiomyopathy as he improves from a CVA   Patient ok for discharge from a cardiac perspective on meds as they are currently ordered. I will have our office arrange for follow up.        For questions or updates, please contact [facility] Please consult [email or web address] for contact info under Cardiology/STEMI.    Signed, Eula Listen, PA-C [facility] Pager: [phone] 06/19/2022, 12:38 PM

## 2022-06-19 NOTE — Progress Notes (Signed)
Pt is now discharged. EMS came and got him at around 2145H. Pt stable, due night meds given. No complaints made.

## 2022-06-19 NOTE — Telephone Encounter (Signed)
Monitor mailed to alternate address

## 2022-06-19 NOTE — Telephone Encounter (Signed)
Please mail this patient's Zio patch to his son Merrel Ferraioli) at the following address.  Diagnosis CVA.  Amie Critchley  [address] [place], [place] [address]

## 2022-06-19 NOTE — TOC Progression Note (Addendum)
Transition of Care [facility]) - Progression Note    Patient Details  Name: Wesley Barry MRN: [id number] Date of Birth: [birth year]  [facility] CM/SW Contact  Margarito Liner, LCSW Phone Number: 06/19/2022, 10:51 AM  Clinical Narrative:  PASARR obtained: [phone] A.   2:09 pm: Reviewed bed offers with patient and son at bedside. Patient wants to stay in [facility] so he chose [facility]. Admissions coordinator is aware. CSW started [place] and it has already been approved. Auth number has not generated yet. Reference # [id number]. Valid 5/17-5/21. Left message for admissions coordinator to notify and see if we need to wait on auth number to generate before he can admit.  2:56 pm: Auth number obtained: [id number]. [facility] can accept him today. Son is aware.  Expected Discharge Plan: Skilled Nursing Facility Barriers to Discharge: Continued Medical Work up  Expected Discharge Plan and Services       Living arrangements for the past 2 months: Single Family Home                                       Social Determinants of Health (SDOH) Interventions SDOH Screenings   Food Insecurity: No Food Insecurity (06/17/2022)  Housing: Low Risk  (06/17/2022)  Transportation Needs: No Transportation Needs (06/17/2022)  Utilities: Not At Risk (06/17/2022)  Tobacco Use: Medium Risk (06/17/2022)    Readmission Risk Interventions     No data to display

## 2022-06-26 DIAGNOSIS — I498 Other specified cardiac arrhythmias: Secondary | ICD-10-CM | POA: Diagnosis not present

## 2022-07-22 NOTE — Progress Notes (Deleted)
Office Visit    Patient Name: Wesley Barry Date of Encounter: 07/22/2022  Primary Care Provider:  Patient, No Pcp Per Primary Cardiologist:  Julien Nordmann, MD  Chief Complaint    67 year old male with past medical history of HFrEF, stroke (06/2022), 50-pack-year history, obesity, shingles, diabetes and alcohol use. He presents today for follow up regarding cardiomyopathy.   Past Medical History    Past Medical History:  Diagnosis Date   History of shingles 2021   Past Surgical History:  Procedure Laterality Date   HERNIA REPAIR      Allergies  Allergies  Allergen Reactions   Tylenol With Codeine #3 [Acetaminophen-Codeine]      Labs/Other Studies Reviewed    The following studies were reviewed today: *** Cardiac Studies & Procedures       ECHOCARDIOGRAM  ECHOCARDIOGRAM COMPLETE 06/16/2022  Narrative ECHOCARDIOGRAM REPORT    Patient Name:   Wesley Barry [facility] Date of Exam: 06/16/2022 Medical Rec #:  [id number]         Height:       69.0 in Accession #:    [id number]        Weight:       260.0 lb Date of Birth:  October 14, 1955          BSA:          2.310 m Patient Age:    67 years          BP:           156/112 mmHg Patient Gender: M                 HR:           88 bpm. Exam Location:  [facility]  Procedure: 2D Echo, Cardiac Doppler and Color Doppler  Indications:     TIA G 45.9  History:         Patient has no prior history of Echocardiogram examinations. Risk Factors:Hypertension. Alcohol abuse , CVA.  Sonographer:     Cristela Blue Referring Phys:  [phone] Francoise Schaumann [place] Diagnosing Phys: Yvonne Kendall MD   Sonographer Comments: Suboptimal apical window and no subcostal window. IMPRESSIONS   1. Left ventricular ejection fraction, by estimation, is 40 to 45%. The left ventricle has mildly decreased function. The left ventricle demonstrates global hypokinesis. There is severe left ventricular hypertrophy. Left ventricular diastolic parameters are  consistent with Grade I diastolic dysfunction (impaired relaxation). 2. Right ventricular systolic function is normal. The right ventricular size is normal. 3. The mitral valve is grossly normal. No evidence of mitral valve regurgitation. No evidence of mitral stenosis. 4. The aortic valve was not well visualized. Aortic valve regurgitation is mild. No aortic stenosis is present.  FINDINGS Left Ventricle: Left ventricular ejection fraction, by estimation, is 40 to 45%. The left ventricle has mildly decreased function. The left ventricle demonstrates global hypokinesis. The left ventricular internal cavity size was normal in size. There is severe left ventricular hypertrophy. Left ventricular diastolic parameters are consistent with Grade I diastolic dysfunction (impaired relaxation).  Right Ventricle: The right ventricular size is normal. Right vetricular wall thickness was not well visualized. Right ventricular systolic function is normal.  Left Atrium: Left atrial size was normal in size.  Right Atrium: Right atrial size was normal in size.  Pericardium: The pericardium was not well visualized. Presence of epicardial fat layer.  Mitral Valve: The mitral valve is grossly normal. No evidence of mitral valve regurgitation. No evidence of mitral valve  stenosis. MV peak gradient, 2.3 mmHg. The mean mitral valve gradient is 1.0 mmHg.  Tricuspid Valve: The tricuspid valve is not well visualized. Tricuspid valve regurgitation is trivial.  Aortic Valve: The aortic valve was not well visualized. Aortic valve regurgitation is mild. No aortic stenosis is present. Aortic valve mean gradient measures 2.5 mmHg. Aortic valve peak gradient measures 3.8 mmHg. Aortic valve area, by VTI measures 5.31 cm.  Pulmonic Valve: The pulmonic valve was not well visualized. Pulmonic valve regurgitation is not visualized. No evidence of pulmonic stenosis.  Aorta: The aortic root is normal in size and  structure.  Pulmonary Artery: The pulmonary artery is of normal size.  IAS/Shunts: The interatrial septum was not well visualized.   LEFT VENTRICLE PLAX 2D LVIDd:         3.30 cm   Diastology LVIDs:         2.55 cm   LV e' medial:    4.24 cm/s LV PW:         1.60 cm   LV E/e' medial:  10.5 LV IVS:        1.70 cm   LV e' lateral:   6.53 cm/s LVOT diam:     2.30 cm   LV E/e' lateral: 6.8 LV SV:         70 LV SV Index:   30 LVOT Area:     4.15 cm   RIGHT VENTRICLE RV Basal diam:  3.00 cm RV Mid diam:    3.00 cm RV S prime:     11.20 cm/s TAPSE (M-mode): 3.0 cm  LEFT ATRIUM             Index        RIGHT ATRIUM           Index LA diam:        2.65 cm 1.15 cm/m   RA Area:     15.90 cm LA Vol (A2C):   40.6 ml 17.58 ml/m  RA Volume:   40.90 ml  17.71 ml/m LA Vol (A4C):   19.8 ml 8.57 ml/m LA Biplane Vol: 30.9 ml 13.38 ml/m AORTIC VALVE AV Area (Vmax):    4.09 cm AV Area (Vmean):   3.73 cm AV Area (VTI):     5.31 cm AV Vmax:           97.30 cm/s AV Vmean:          70.950 cm/s AV VTI:            0.132 m AV Peak Grad:      3.8 mmHg AV Mean Grad:      2.5 mmHg LVOT Vmax:         95.80 cm/s LVOT Vmean:        63.700 cm/s LVOT VTI:          0.168 m LVOT/AV VTI ratio: 1.28  AORTA Ao Root diam: 3.60 cm  MITRAL VALVE               TRICUSPID VALVE MV Area (PHT): 4.36 cm    TR Peak grad:   11.8 mmHg MV Area VTI:   3.94 cm    TR Vmax:        172.00 cm/s MV Peak grad:  2.3 mmHg MV Mean grad:  1.0 mmHg    SHUNTS MV Vmax:       0.76 m/s    Systemic VTI:  0.17 m MV Vmean:      49.8 cm/s  Systemic Diam: 2.30 cm MV Decel Time: 174 msec MV E velocity: 44.50 cm/s MV A velocity: 71.80 cm/s MV E/A ratio:  0.62  Cristal Deer End MD Electronically signed by Yvonne Kendall MD Signature Date/Time: 06/16/2022/6:34:52 PM    Final    MONITORS  LONG TERM MONITOR (3-14 DAYS) 07/21/2022  Narrative HR 65 - 164 bpm, average 92 bpm. 1 nonsustained SVT lasting 7 beats. Rare  supraventricular and ventricular ectopy. No atrial fibrillation. No sustained arrhythmias.  Sheria Lang T. Lalla Brothers, MD, [facility], [facility] Cardiac Electrophysiology          Recent Labs: 06/16/2022: ALT 47 06/17/2022: TSH 1.557 06/19/2022: BUN 18; Creatinine, Ser 0.74; Hemoglobin 16.1; Magnesium 2.6; Platelets 211; Potassium 3.5; Sodium 138  Recent Lipid Panel    Component Value Date/Time   CHOL 289 (H) 06/17/2022 0712   TRIG 153 (H) 06/17/2022 0712   HDL 38 (L) 06/17/2022 0712   CHOLHDL 7.6 06/17/2022 0712   VLDL 31 06/17/2022 0712   LDLCALC 220 (H) 06/17/2022 0712    History of Present Illness    67 year old male with past medical history of HFrEF, stroke (06/2022), 50-pack-year history, obesity, shingles, diabetes and alcohol use. He presents today for follow up regarding cardiomyopathy.   Initially seen by Dr. Mariah Milling in 06/2021 for shortness of breath, chest pain and fluttering in chest. He was hypertensive on exam. A cardiac CTA was ordered but was not completed.  He presented to the ED on 06/16/2022 as a code stroke, after waking with right-sided weakness and inability to walk.  He was not a candidate for tPA due to uncertain timing of onset of symptoms.  Brain MRI indicated acute perforator infarct in the left basal ganglia. In stroke workup his echo indicated an LVEF of 40 to 45% with global hypokinesis.  While inpatient he denied any recent chest pain or shortness of breath or palpitations.  Given lack of symptoms and recent stroke ischemia evaluation was deferred. He as started on high intensity atorvastatin, DAPT of plavix and aspirin, atorvastatin, Jardiance, Toprol-XL and entresto.   After discharge he wore a cardiac monitor that showed predominant rhythm of sinus with an average rate of 92bpm (range 65 to 164bpm), 1 episode of non-sustained SVT lasting 7 beats. Rare supraventricular and ventricular ectopy. There was no atrial fibrillation or sustained arrhythmias.   Echocardiogram showed  normal LV size with severe LVH. LVEF 40-45% with grade 1 diastolic dysfunction. Normal RV size and function. Mild aortic regurgitation.   - Need ischemic workup CTA vs MPI vs cath  - BP - PCP for diabetes?   Cardiomyopathy/HFrEF LVEF found to be 40-45%, global hypokinesis, G1 DD, no valvular abnormalities indicated  Continue atorvastatin, DAPT of plavix and aspirin, atorvastatin, Jardiance, Toprol-XL and entresto.  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 Swallow whole. 30 tablet 3   atorvastatin (LIPITOR) 80 MG tablet Take 1 tablet (80 mg total) by mouth at bedtime. 30 tablet 3   clopidogrel (PLAVIX) 75 MG tablet Take 1 tablet (75 mg total) by mouth daily. 30 tablet 3   empagliflozin (JARDIANCE) 10 MG TABS tablet Take 1 tablet (10 mg total) by mouth daily. 30 tablet 3   folic acid (FOLVITE) 1 MG tablet Take 1 tablet (1 mg total) by mouth daily. 30 tablet 0   insulin glargine (LANTUS) 100 UNIT/ML injection Inject 0.15 mLs (15 Units total) into the skin at bedtime. 10 mL 3   metoprolol succinate (TOPROL-XL) 50 MG 24 hr tablet Take 1 tablet (50 mg total) by mouth daily. Take with or immediately following a meal. 30 tablet 3   sacubitril-valsartan (ENTRESTO) 24-26  MG Take 1 tablet by mouth 2 (two) times daily. 60 tablet 3   No current facility-administered medications for this visit.     Review of Systems    ***.  All other systems reviewed and are otherwise negative except as noted above.    Physical Exam    VS:  There were no vitals taken for this visit. , BMI There is no height or weight on file to calculate BMI.     GEN: Well nourished, well developed, in no acute distress. HEENT: normal. Neck: Supple, no JVD, carotid bruits, or masses. Cardiac: RRR, no murmurs, rubs, or gallops. No clubbing, cyanosis, edema.  Radials/DP/PT 2+ and equal bilaterally.  Respiratory:  Respirations regular and unlabored, clear to auscultation bilaterally. GI: Soft, nontender, nondistended, BS + x 4. MS: no deformity or atrophy. Skin: warm and dry, no rash. Neuro:  Strength and sensation are intact. Psych: Normal affect.  Accessory Clinical Findings    ECG personally reviewed by me today - *** - no acute changes.   Lab Results  Component Value Date   WBC 7.7 06/19/2022   HGB 16.1 06/19/2022   HCT 50.5 06/19/2022   MCV 90.7 06/19/2022   PLT 211 06/19/2022   Lab Results  Component Value Date   CREATININE 0.74 06/19/2022   BUN 18 06/19/2022   NA 138 06/19/2022   K 3.5 06/19/2022   CL 102 06/19/2022   CO2 27 06/19/2022   Lab Results  Component Value Date   ALT 47 (H) 06/16/2022   AST 35 06/16/2022   ALKPHOS 21 (L) 06/16/2022   BILITOT 0.9 06/16/2022   Lab Results  Component Value Date   CHOL 289 (H) 06/17/2022   HDL 38 (L) 06/17/2022   LDLCALC 220 (H) 06/17/2022   TRIG 153 (H) 06/17/2022   CHOLHDL 7.6 06/17/2022    Lab Results  Component Value Date   HGBA1C 11.5 (H) 06/16/2022    Assessment & Plan    1.  ***  No BP recorded.  {Refresh Note OR Click here to enter BP  :1}***   Rip Harbour, NP 07/22/2022, 8:48 PM

## 2022-07-23 ENCOUNTER — Ambulatory Visit: Payer: Medicare HMO | Admitting: Nurse Practitioner

## 2022-07-23 ENCOUNTER — Encounter: Payer: Self-pay | Admitting: Nurse Practitioner

## 2022-07-23 DIAGNOSIS — I5020 Unspecified systolic (congestive) heart failure: Secondary | ICD-10-CM | POA: Insufficient documentation

## 2022-07-29 ENCOUNTER — Telehealth: Payer: Self-pay | Admitting: *Deleted

## 2022-07-29 ENCOUNTER — Encounter: Payer: Self-pay | Admitting: *Deleted

## 2022-07-29 NOTE — Telephone Encounter (Signed)
Pt no showed for his appointment for them to review results with him. No answer/No voicemail.   Mailing letter to patient with results and recommendations to call in for review or if any questions.     Bryna Colander, RN 07/29/2022  8:57 AM EDT Back to Top    No answer/no voicemail   Bryna Colander, RN 07/21/2022  1:26 PM EDT     No voicemail box set up. Will defer results until appointment in 2 days.

## 2022-07-29 NOTE — Telephone Encounter (Signed)
-----   Message from Sondra Barges, [place] sent at 07/21/2022 12:52 PM EDT ----- Cardiac monitor showed a predominant rhythm of sinus with an average rate of 92 bpm (range 65 to 164 bpm), 1 episode of SVT (fast rhythm coming from the top portion of the heart) lasting just 7 beats, no A-fib or sustained arrhythmias.  Follow-up as scheduled later this week.

## 2023-01-07 ENCOUNTER — Telehealth: Payer: Self-pay | Admitting: Cardiovascular Disease

## 2023-01-07 MED ORDER — ENTRESTO 24-26 MG PO TABS: 1.0000 | ORAL_TABLET | Freq: Two times a day (BID) | ORAL | 0 refills | Status: DC

## 2023-01-07 NOTE — Telephone Encounter (Signed)
Daughter calling in to discuss Entresto help. Discussed what is needed to apply and also provided information regarding the extra help through [facility]. She will look into this for him. She did inquire about some samples and will provide 2 bottles. She did want me to call her back if in fact we do have any on hand to provide. She was appreciative for the help with no further questions at this time.

## 2023-01-07 NOTE — Telephone Encounter (Signed)
Left voicemail message to call back  

## 2023-01-07 NOTE — Telephone Encounter (Signed)
Pt c/o medication issue:  1. Name of Medication:   sacubitril-valsartan (ENTRESTO) 24-26 MG   2. How are you currently taking this medication (dosage and times per day)?   3. Are you having a reaction (difficulty breathing--STAT)?   4. What is your medication issue?   Daughter Boneta Lucks) stated patient ran out of this medication last week and can no longer afford this medication.  Daughter wants a call back directly to patient to discuss assistance getting this medication.

## 2023-01-08 NOTE — Telephone Encounter (Signed)
Spoke with daughter and advised that I do have some samples available for him to pick up. She verbalized understanding with no further questions at this time.

## 2023-01-22 NOTE — Telephone Encounter (Signed)
Application completed and waiting on income documentation.

## 2023-02-04 ENCOUNTER — Telehealth: Payer: Self-pay | Admitting: Cardiovascular Disease

## 2023-02-04 MED ORDER — ENTRESTO 24-26 MG PO TABS: 1.0000 | ORAL_TABLET | Freq: Two times a day (BID) | ORAL | 0 refills | Status: DC

## 2023-02-04 NOTE — Telephone Encounter (Signed)
 Patient calling the office for samples of medication:   1.  What medication and dosage are you requesting samples for?  sacubitril -valsartan  (ENTRESTO ) 24-26 MG   2.  Are you currently out of this medication?    Caller Bobbetta) stated patient is completely out of this medication.

## 2023-02-04 NOTE — Telephone Encounter (Signed)
 Spoke with Wesley Barry per release form. Reviewed that we have application for assistance completed but that we need his income documentation in order to submit to company. Also reviewed that we do have some samples available for pick up. She verbalized understanding of our conversation with no further questions at this time.

## 2023-02-12 MED ORDER — ENTRESTO 24-26 MG PO TABS: 1.0000 | ORAL_TABLET | Freq: Two times a day (BID) | ORAL | 0 refills | Status: DC

## 2023-02-12 NOTE — Addendum Note (Signed)
 Addended by: Bryna Colander on: 02/12/2023 11:14 AM   Modules accepted: Orders

## 2023-02-25 ENCOUNTER — Encounter: Payer: Self-pay | Admitting: Nurse Practitioner

## 2023-02-25 ENCOUNTER — Ambulatory Visit: Payer: Medicare HMO | Attending: Nurse Practitioner | Admitting: Nurse Practitioner

## 2023-02-25 VITALS — BP 124/70 | HR 89 | Ht 68.5 in | Wt 202.6 lb

## 2023-02-25 DIAGNOSIS — F1010 Alcohol abuse, uncomplicated: Secondary | ICD-10-CM

## 2023-02-25 DIAGNOSIS — I429 Cardiomyopathy, unspecified: Secondary | ICD-10-CM

## 2023-02-25 DIAGNOSIS — E785 Hyperlipidemia, unspecified: Secondary | ICD-10-CM

## 2023-02-25 DIAGNOSIS — I5022 Chronic systolic (congestive) heart failure: Secondary | ICD-10-CM | POA: Diagnosis not present

## 2023-02-25 DIAGNOSIS — E1165 Type 2 diabetes mellitus with hyperglycemia: Secondary | ICD-10-CM

## 2023-02-25 DIAGNOSIS — Z720 Tobacco use: Secondary | ICD-10-CM

## 2023-02-25 MED ORDER — METOPROLOL TARTRATE 100 MG PO TABS: 100.0000 mg | ORAL_TABLET | Freq: Once | ORAL | 0 refills | Status: DC

## 2023-02-25 MED ORDER — SACUBITRIL-VALSARTAN 24-26 MG PO TABS: 1.0000 | ORAL_TABLET | Freq: Two times a day (BID) | ORAL | 3 refills | Status: DC

## 2023-02-25 NOTE — Patient Instructions (Addendum)
Medication Instructions:  No changes at that this time.   *If you need a refill on your cardiac medications before your next appointment, please call your pharmacy*   Lab Work: CMET & Lipid panel today  If you have labs (blood work) drawn today and your tests are completely normal, you will receive your results only by: MyChart Message (if you have MyChart) OR A paper copy in the mail If you have any lab test that is abnormal or we need to change your treatment, we will call you to review the results.   Testing/Procedures:   Your cardiac CT will be scheduled at one of the below locations:    [facility] [address] [place], [place] [address] [phone]  OR   [facility] [address] [place], [place] [address] [phone]   If scheduled at [facility] or [facility], please arrive 15 mins early for check-in and test prep.  There is spacious parking and easy access to the radiology department from the [facility] entrance. Please enter here and check-in with the desk attendant.   Please follow these instructions carefully (unless otherwise directed):  An IV will be required for this test and Nitroglycerin will be given.  Hold all erectile dysfunction medications at least 3 days (72 hrs) prior to test. (Ie viagra, cialis, sildenafil, tadalafil, etc)   On the Night Before the Test: Be sure to Drink plenty of water. Do not consume any caffeinated/decaffeinated beverages or chocolate 12 hours prior to your test. Do not take any antihistamines 12 hours prior to your test. Take only half of your insulin the night before. Which would be Lantus 7 units    On the Day of the Test: Drink plenty of water until 1 hour prior to the test. Do not eat any food 1 hour prior to test. You may take your regular medications prior to the test.  Take  metoprolol (Lopressor) 100 mg two hours prior to test and your normal daily dose.  If you take Furosemide/Hydrochlorothiazide/Spironolactone/Chlorthalidone, please HOLD on the morning of the test. Patients who wear a continuous glucose monitor MUST remove the device prior to scanning.    After the Test: Drink plenty of water. After receiving IV contrast, you may experience a mild flushed feeling. This is normal. On occasion, you may experience a mild rash up to 24 hours after the test. This is not dangerous. If this occurs, you can take Benadryl 25 mg and increase your fluid intake. If you experience trouble breathing, this can be serious. If it is severe call [phone] IMMEDIATELY. If it is mild, please call our office.  We will call to schedule your test 2-4 weeks out understanding that some insurance companies will need an authorization prior to the service being performed.   For more information and frequently asked questions, please visit our website : [email or web address]  For non-scheduling related questions, please contact the cardiac imaging nurse navigator should you have any questions/concerns: Cardiac Imaging Nurse Navigators Direct Office Dial: [phone]   For scheduling needs, including cancellations and rescheduling, please call [place], [phone].    Follow-Up: At [facility], you and your health needs are our priority.  As part of our continuing mission to provide you with exceptional heart care, we have created designated Provider Care Teams.  These Care Teams include your primary Cardiologist (physician) and Advanced Practice Providers (APPs -  Physician Assistants and  Nurse Practitioners) who all work together to provide you with the care you need, when you need it.  We recommend signing up for the patient portal called "MyChart".  Sign up information is provided on this After Visit Summary.  MyChart is used to connect with patients for Virtual  Visits (Telemedicine).  Patients are able to view lab/test results, encounter notes, upcoming appointments, etc.  Non-urgent messages can be sent to your provider as well.   To learn more about what you can do with MyChart, go to [email or web address].    Your next appointment:   1 month(s)  Provider:   Julien Nordmann, MD or Nicolasa Ducking, NP

## 2023-02-25 NOTE — Progress Notes (Signed)
Office Visit    Patient Name: Wesley Barry Date of Encounter: 02/25/2023  Primary Care Provider:  Patient, No Pcp Per Primary Cardiologist:  Julien Nordmann, MD  Chief Complaint    68 y.o. male with a history of cardiomyopathy/HFmrEF, stroke (May 2024), 50-pack-year history tobacco abuse, obesity, shingles, diabetes, and alcohol use, who presents for follow-up regarding cardiomyopathy.  Past Medical History  Subjective   Past Medical History:  Diagnosis Date   Alcohol abuse    Cardiomyopathy (HCC)    a. 06/2022 Echo: EF 40-45%.  Global HK.  Severe LVH.  Grade 1 DD.  Nl RV fxn. Mild AI.   History of shingles 2021   Morbid obesity (HCC)    Stroke [facility])    a. 06/2022 MRI: Acute infarct of the left basal ganglia; b. 06/2022 Zio: Predominantly sinus rhythm, average 92 (65-164).  1, 7 beat run of nonsustained SVT.  Rare PACs/PVCs.  No A-fib or sustained arrhythmias.   Tobacco abuse    Type 2 diabetes mellitus (HCC)    Past Surgical History:  Procedure Laterality Date   HERNIA REPAIR      Allergies  Allergies  Allergen Reactions   Tylenol With Codeine #3 [Acetaminophen-Codeine]       History of Present Illness      68 y.o. y/o male with a history of cardiomyopathy/HFmrEF, stroke (May 2024), 50-pack-year history tobacco abuse, obesity, shingles, diabetes, and alcohol use.  Wesley Barry with previous evaluated May 2023 in the setting of dyspnea, chest pain, and palpitations.  A cardiac CTA was ordered however, patient did not follow through.  In May 2024, patient presented to [facility] with right-sided weakness and a code stroke was called.  MRI of the brain showed acute infarct of the left basal ganglia.  He was not a candidate for tPA.  An echo was performed and showed an EF of 40 to 45% with global hypokinesis, severe LVH, grade 1 diastolic dysfunction, normal RV function, and mild AI.  He was seen by our team and placed on GDMT with recommendation for outpatient  workup of cardiomyopathy, following recovery from stroke.  Subsequent outpatient monitoring showed predominantly sinus rhythm with 1 brief run of SVT, and no episodes of A-fib/flutter.    Patient presents today for the first time, having previously missed outpatient cardiology follow-up appointments.  He notes that he has recovered reasonably well from his stroke, he is able to walk though still has right leg weakness, and has limited use of his right hand and significant right arm weakness.  He notes compliance with his medications, which his primary care provider has been refilling.  He does not think he had any lab work since May.  He denies chest pain, dyspnea, palpitations, PND, orthopnea, dizziness, syncope, edema, or early satiety.  He no longer smokes or uses alcohol. Objective  Home Medications    Current Outpatient Medications  Medication Sig Dispense Refill   aspirin EC 81 MG tablet Take 1 tablet (81 mg total) by mouth daily. Swallow whole. 30 tablet 3   atorvastatin (LIPITOR) 80 MG tablet Take 1 tablet (80 mg total) by mouth at bedtime. 30 tablet 3   empagliflozin (JARDIANCE) 10 MG TABS tablet Take 1 tablet (10 mg total) by mouth daily. 30 tablet 3   folic acid (FOLVITE) 1 MG tablet Take 1 tablet (1 mg total) by mouth daily. 30 tablet 0   insulin glargine (LANTUS) 100 UNIT/ML injection Inject 0.15 mLs (15 Units total) into the  skin at bedtime. 10 mL 3   metoprolol succinate (TOPROL-XL) 50 MG 24 hr tablet Take 1 tablet (50 mg total) by mouth daily. Take with or immediately following a meal. 30 tablet 3   sacubitril-valsartan (ENTRESTO) 24-26 MG Take 1 tablet by mouth 2 (two) times daily. 60 tablet 3   sacubitril-valsartan (ENTRESTO) 24-26 MG Take 1 tablet by mouth 2 (two) times daily. 56 tablet 0   sacubitril-valsartan (ENTRESTO) 24-26 MG Take 1 tablet by mouth 2 (two) times daily. 56 tablet 0   clopidogrel (PLAVIX) 75 MG tablet Take 1 tablet (75 mg total) by mouth daily. (Patient not  taking: Reported on 02/25/2023) 30 tablet 3   No current facility-administered medications for this visit.     Physical Exam    VS:  BP 124/70   Pulse 89   Ht 5' 8.5" (1.74 m)   Wt 202 lb 9.6 oz (91.9 kg)   SpO2 98%   BMI 30.36 kg/m  , BMI Body mass index is 30.36 kg/m.       GEN: Well nourished, well developed, in no acute distress. HEENT: normal. Neck: Supple, no JVD, carotid bruits, or masses. Cardiac: RRR, no murmurs, rubs, or gallops. No clubbing, cyanosis, edema.  Radials 2+/PT 2+ and equal bilaterally.  Respiratory:  Respirations regular and unlabored, clear to auscultation bilaterally. GI: Soft, nontender, nondistended, BS + x 4. MS: no deformity or atrophy. Skin: warm and dry, no rash. Neuro: Right hemiplegia. Psych: Normal affect.  Accessory Clinical Findings    ECG personally reviewed by me today - EKG Interpretation Date/Time:  Thursday February 25 2023 08:56:02 EST Ventricular Rate:  89 PR Interval:  142 QRS Duration:  104 QT Interval:  388 QTC Calculation: 472 R Axis:   -39  Text Interpretation: Normal sinus rhythm Left axis deviation Confirmed by Nicolasa Ducking [phone]) on 02/25/2023 9:10:07 AM  - no acute changes.  Lab Results  Component Value Date   WBC 7.7 06/19/2022   HGB 16.1 06/19/2022   HCT 50.5 06/19/2022   MCV 90.7 06/19/2022   PLT 211 06/19/2022   Lab Results  Component Value Date   CREATININE 0.74 06/19/2022   BUN 18 06/19/2022   NA 138 06/19/2022   K 3.5 06/19/2022   CL 102 06/19/2022   CO2 27 06/19/2022   Lab Results  Component Value Date   ALT 47 (H) 06/16/2022   AST 35 06/16/2022   ALKPHOS 21 (L) 06/16/2022   BILITOT 0.9 06/16/2022   Lab Results  Component Value Date   CHOL 289 (H) 06/17/2022   HDL 38 (L) 06/17/2022   LDLCALC 220 (H) 06/17/2022   TRIG 153 (H) 06/17/2022   CHOLHDL 7.6 06/17/2022    Lab Results  Component Value Date   HGBA1C 11.5 (H) 06/16/2022   Lab Results  Component Value Date   TSH 1.557  06/17/2022       Assessment & Plan    1.  Cardiomyopathy/heart failure with midrange ejection fraction: Patient was admitted with a stroke in May 2024 and an echocardiogram was performed and showed an EF of 40 to 45% with global hypokinesis, severe LVH, grade 1 diastolic dysfunction, normal RV function, and mild AI.  He was placed on GDMT during his hospitalization including Toprol-XL, Entresto, and Jardiance, but has not followed up in the outpatient setting.  Fortunately, patient has been doing well without chest pain or dyspnea.  He is euvolemic on examination.  We discussed the diagnosis of cardiomyopathy and need for ischemic  evaluation.  I will follow-up a complete metabolic panel today and arrange for coronary CT angiogram.  Continue current medical regimen.  Pending coronary CTA, will need repeat evaluation of ejection fraction in the future.  2.  Type 2 diabetes mellitus: He is followed by his primary care provider and remains on empagliflozin therapy as well as Lantus.  3.  Alcohol abuse: He has not used alcohol since his stroke.  I congratulated him on this and encouraged him to remain off of alcohol.  4.  Tobacco abuse: He has not used tobacco since his stroke.  I congratulated him on this and encouraged him to remain off of tobacco.  5.  History of stroke: Ongoing right-sided weakness though he notes that for the most part he has done quite well.  He remains on aspirin and statin therapy.  Prior event monitoring did not show any A-fib/flutter.  6.  Hyperlipidemia: On atorvastatin 80 mg.  His LDL was 220 in May 2024, prior to initiation of statin.  Follow-up lipids and LFTs today, as he is fasting.  7.  Disposition: Follow-up complete metabolic panel and lipids today.  Follow-up coronary CT angiogram.  Follow-up in clinic in 1 month or sooner if necessary.  Nicolasa Ducking, NP 02/25/2023, 9:10 AM

## 2023-02-26 LAB — LIPID PANEL
Chol/HDL Ratio: 4.6 {ratio} (ref 0.0–5.0)
Cholesterol, Total: 189 mg/dL (ref 100–199)
HDL: 41 mg/dL (ref >39)
LDL Chol Calc (NIH): 132 mg/dL — ABNORMAL HIGH (ref 0–99)
Triglycerides: 86 mg/dL (ref 0–149)
VLDL Cholesterol Cal: 16 mg/dL (ref 5–40)

## 2023-02-26 LAB — COMPREHENSIVE METABOLIC PANEL
ALT: 17 [IU]/L (ref 0–44)
AST: 15 [IU]/L (ref 0–40)
Albumin: 4.5 g/dL (ref 3.9–4.9)
Alkaline Phosphatase: 39 [IU]/L — ABNORMAL LOW (ref 44–121)
BUN/Creatinine Ratio: 12 (ref 10–24)
BUN: 9 mg/dL (ref 8–27)
Bilirubin Total: 0.4 mg/dL (ref 0.0–1.2)
CO2: 23 mmol/L (ref 20–29)
Calcium: 9.4 mg/dL (ref 8.6–10.2)
Chloride: 101 mmol/L (ref 96–106)
Creatinine, Ser: 0.73 mg/dL — ABNORMAL LOW (ref 0.76–1.27)
Globulin, Total: 2.4 g/dL (ref 1.5–4.5)
Glucose: 133 mg/dL — ABNORMAL HIGH (ref 70–99)
Potassium: 4.8 mmol/L (ref 3.5–5.2)
Sodium: 141 mmol/L (ref 134–144)
Total Protein: 6.9 g/dL (ref 6.0–8.5)
eGFR: 99 mL/min/{1.73_m2} (ref >59)

## 2023-03-08 ENCOUNTER — Telehealth: Payer: Self-pay | Admitting: Cardiovascular Disease

## 2023-03-08 MED ORDER — ENTRESTO 24-26 MG PO TABS: 1.0000 | ORAL_TABLET | Freq: Two times a day (BID) | ORAL | Status: DC

## 2023-03-08 NOTE — Telephone Encounter (Signed)
Spoke w/ Olegario Messier (ok per DPR) and advised her that I can leave 1 sample bottle (28 tabs) of Entresto 24-26 mg @ the front desk for her to p/u at her convenience. Also advised her that since pt has Medicare, he can call them to see about being put on a payment plan for his deductible. She is appreciative of the call.

## 2023-03-08 NOTE — Addendum Note (Signed)
Addended by: Marilynne Halsted on: 03/08/2023 02:39 PM   Modules accepted: Orders

## 2023-03-08 NOTE — Telephone Encounter (Signed)
sacubitril-valsartan (ENTRESTO) 24-26 MG  Pt's friend calling to see if we have samples of Entresto due to cost and pt not being able to afford. Requesting cb

## 2023-03-10 ENCOUNTER — Encounter: Payer: Self-pay | Admitting: *Deleted

## 2023-03-31 ENCOUNTER — Telehealth (HOSPITAL_COMMUNITY): Payer: Self-pay | Admitting: *Deleted

## 2023-03-31 ENCOUNTER — Ambulatory Visit: Payer: Medicare HMO | Admitting: Nurse Practitioner

## 2023-03-31 NOTE — Telephone Encounter (Signed)
Attempted to call patient regarding upcoming cardiac CT appointment. Unable to leave a message.  Jelani Trueba RN Navigator Cardiac Imaging [place] [facility] [phone] Office [phone] Cell  

## 2023-04-01 ENCOUNTER — Ambulatory Visit
Admission: RE | Admit: 2023-04-01 | Discharge: 2023-04-01 | Disposition: A | Discharge status: Home / Self Care | Payer: Medicare HMO | Source: Ambulatory Visit | Attending: Nurse Practitioner | Admitting: Nurse Practitioner

## 2023-04-01 DIAGNOSIS — I429 Cardiomyopathy, unspecified: Secondary | ICD-10-CM | POA: Diagnosis present

## 2023-04-01 MED ORDER — DILTIAZEM HCL 25 MG/5ML IV SOLN
10.0000 mg | INTRAVENOUS | Status: DC | PRN
  Administered 2023-04-01: 10 mg via INTRAVENOUS

## 2023-04-01 MED ORDER — NITROGLYCERIN 0.4 MG SL SUBL
0.8000 mg | SUBLINGUAL_TABLET | Freq: Once | SUBLINGUAL | Status: AC
  Administered 2023-04-01: 0.8 mg via SUBLINGUAL

## 2023-04-01 MED ORDER — SODIUM CHLORIDE 0.9 % IV BOLUS
150.0000 mL | Freq: Once | INTRAVENOUS | Status: AC
  Administered 2023-04-01: 150 mL via INTRAVENOUS

## 2023-04-01 MED ORDER — METOPROLOL TARTRATE 5 MG/5ML IV SOLN
10.0000 mg | Freq: Once | INTRAVENOUS | Status: AC | PRN
  Administered 2023-04-01: 10 mg via INTRAVENOUS

## 2023-04-01 MED ORDER — IOHEXOL 350 MG/ML SOLN
100.0000 mL | Freq: Once | INTRAVENOUS | Status: AC | PRN
  Administered 2023-04-01: 100 mL via INTRAVENOUS

## 2023-04-01 NOTE — Progress Notes (Signed)
 Discussed patient with Dr. Myriam Forehand, patient given medications (see MAR), VS remain stable (see flowsheet). Unable to get HR to 60 for study. Patient navigator to call patient. Patient is rescheduled at [facility] hospital CT for March 10th at 10:15 and patient is to take Metoprolol 100 mg and Ivabridine 15 mg two hours prior to study. Patient aware and understanding. Ambulates w/o difficulty, ABC intact, no further questions. Information provided to his son who is with him also.

## 2023-04-07 ENCOUNTER — Other Ambulatory Visit: Payer: Self-pay | Admitting: Nurse Practitioner

## 2023-04-07 NOTE — Telephone Encounter (Signed)
 Unable to LVM to verify if pt was able to pick up one time dose for CT. Pt VM is full/No answer.

## 2023-04-08 ENCOUNTER — Other Ambulatory Visit (HOSPITAL_COMMUNITY): Payer: Self-pay | Admitting: *Deleted

## 2023-04-08 MED ORDER — METOPROLOL TARTRATE 100 MG PO TABS: ORAL_TABLET | ORAL | 0 refills | Status: DC

## 2023-04-09 ENCOUNTER — Telehealth (HOSPITAL_COMMUNITY): Payer: Self-pay | Admitting: *Deleted

## 2023-04-09 NOTE — Telephone Encounter (Signed)
Attempted to call patient regarding upcoming cardiac CT appointment. Unable to leave a message.  Jelani Trueba RN Navigator Cardiac Imaging [place] [facility] [phone] Office [phone] Cell  

## 2023-04-12 ENCOUNTER — Ambulatory Visit
Admission: RE | Admit: 2023-04-12 | Discharge: 2023-04-12 | Disposition: A | Discharge status: Home / Self Care | Payer: Medicare HMO | Source: Ambulatory Visit | Attending: Nurse Practitioner | Admitting: Nurse Practitioner

## 2023-04-12 ENCOUNTER — Other Ambulatory Visit: Payer: Self-pay | Admitting: Nurse Practitioner

## 2023-04-12 DIAGNOSIS — I429 Cardiomyopathy, unspecified: Secondary | ICD-10-CM | POA: Insufficient documentation

## 2023-04-12 DIAGNOSIS — R931 Abnormal findings on diagnostic imaging of heart and coronary circulation: Secondary | ICD-10-CM | POA: Insufficient documentation

## 2023-04-12 DIAGNOSIS — I5022 Chronic systolic (congestive) heart failure: Secondary | ICD-10-CM | POA: Diagnosis not present

## 2023-04-12 DIAGNOSIS — I2510 Atherosclerotic heart disease of native coronary artery without angina pectoris: Secondary | ICD-10-CM | POA: Insufficient documentation

## 2023-04-12 LAB — POCT I-STAT CREATININE: Creatinine, Ser: 0.9 mg/dL (ref 0.61–1.24)

## 2023-04-12 MED ORDER — IOHEXOL 350 MG/ML SOLN
80.0000 mL | Freq: Once | INTRAVENOUS | Status: AC | PRN
  Administered 2023-04-12: 80 mL via INTRAVENOUS

## 2023-04-12 MED ORDER — METOPROLOL TARTRATE 5 MG/5ML IV SOLN
10.0000 mg | Freq: Once | INTRAVENOUS | Status: DC | PRN
  Filled 2023-04-12: qty 10

## 2023-04-12 MED ORDER — DILTIAZEM HCL 25 MG/5ML IV SOLN
10.0000 mg | INTRAVENOUS | Status: DC | PRN
  Filled 2023-04-12: qty 5

## 2023-04-12 MED ORDER — NITROGLYCERIN 0.4 MG SL SUBL
0.8000 mg | SUBLINGUAL_TABLET | Freq: Once | SUBLINGUAL | Status: AC
  Administered 2023-04-12: 0.8 mg via SUBLINGUAL
  Filled 2023-04-12: qty 25

## 2023-04-12 NOTE — Progress Notes (Signed)
 Patient tolerated procedure well. Ambulate w/o difficulty. Denies any lightheadedness or being dizzy. Pt denies any pain at this time. Sitting in chair. Pt is encouraged to drink additional water throughout the day and reason explained to patient. Patient verbalized understanding and all questions answered. ABC intact. No further needs at this time. Discharge from procedure area w/o issues.

## 2023-04-14 ENCOUNTER — Telehealth: Payer: Self-pay | Admitting: *Deleted

## 2023-04-14 NOTE — Telephone Encounter (Signed)
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  In this setting, we'll need to discuss pursuit of a cardiac catheterization.  He currently has follow-up on 3/27.  Recommend moving that to a sooner date with me or his MD (ok to open an afternoon slot 2p or later on a hospital day if necessary).

## 2023-04-14 NOTE — Telephone Encounter (Signed)
 Patient made aware of results and verbalized understanding. The patient did state that he is asymptomatic.   He has been advised that we will call him back with an appointment date.

## 2023-04-14 NOTE — Telephone Encounter (Signed)
 Feel free to add onto one of my office or hospital days.

## 2023-04-16 NOTE — Telephone Encounter (Signed)
 Called and spoke with patient. Patient scheduled 04/22/23.

## 2023-04-16 NOTE — Telephone Encounter (Signed)
 Pt calling sating someone was supposed to call her back, requesting cb

## 2023-04-22 ENCOUNTER — Ambulatory Visit: Attending: Nurse Practitioner | Admitting: Nurse Practitioner

## 2023-04-22 ENCOUNTER — Encounter: Payer: Self-pay | Admitting: Nurse Practitioner

## 2023-04-22 VITALS — BP 120/62 | HR 74 | Ht 69.0 in | Wt 204.1 lb

## 2023-04-22 DIAGNOSIS — I2510 Atherosclerotic heart disease of native coronary artery without angina pectoris: Secondary | ICD-10-CM

## 2023-04-22 DIAGNOSIS — I25110 Atherosclerotic heart disease of native coronary artery with unstable angina pectoris: Secondary | ICD-10-CM | POA: Diagnosis not present

## 2023-04-22 DIAGNOSIS — E1165 Type 2 diabetes mellitus with hyperglycemia: Secondary | ICD-10-CM

## 2023-04-22 DIAGNOSIS — I429 Cardiomyopathy, unspecified: Secondary | ICD-10-CM

## 2023-04-22 DIAGNOSIS — I5022 Chronic systolic (congestive) heart failure: Secondary | ICD-10-CM | POA: Diagnosis not present

## 2023-04-22 DIAGNOSIS — I200 Unstable angina: Secondary | ICD-10-CM

## 2023-04-22 DIAGNOSIS — E785 Hyperlipidemia, unspecified: Secondary | ICD-10-CM | POA: Diagnosis not present

## 2023-04-22 MED ORDER — NITROGLYCERIN 0.4 MG SL SUBL: 0.4000 mg | SUBLINGUAL_TABLET | SUBLINGUAL | 1 refills | Status: AC | PRN

## 2023-04-22 MED ORDER — CLOPIDOGREL BISULFATE 75 MG PO TABS: 75.0000 mg | ORAL_TABLET | Freq: Every day | ORAL | 6 refills | Status: DC

## 2023-04-22 NOTE — H&P (View-Only) (Signed)
 Cardiology Clinic Note   Patient Name: Wesley Barry Date of Encounter: 04/22/2023  Primary Care Provider:  Emogene Morgan, MD Primary Cardiologist:  Wesley Nordmann, MD  Patient Profile    68 y.o. male with a history of cardiomyopathy/HFmrEF, stroke (May 2024), 50-pack-year history tobacco abuse, obesity, shingles, diabetes, and alcohol use, who presents for follow-up after recent abnormal coronary CT angiogram.  Past Medical History    Past Medical History:  Diagnosis Date   Alcohol abuse    CAD (coronary artery disease)    a. 04/2023 Cor CTA: LAD 50-74m (FFRct 0.78), LCX 25-39m (FFRct 0.97), RCA 25-23m (FFRct 0.93). Cor Ca2+ = 2282 (95th%'ile).   Cardiomyopathy (HCC)    a. 06/2022 Echo: EF 40-45%.  Global HK.  Severe LVH.  Grade 1 DD.  Nl RV fxn. Mild AI.   Heart failure with mid-range ejection fraction [facility])    History of shingles 2021   Morbid obesity (HCC)    Stroke (HCC)    a. 06/2022 MRI: Acute infarct of the left basal ganglia; b. 06/2022 Zio: Predominantly sinus rhythm, average 92 (65-164).  1, 7 beat run of nonsustained SVT.  Rare PACs/PVCs.  No A-fib or sustained arrhythmias.   Tobacco abuse    Type 2 diabetes mellitus (HCC)    Past Surgical History:  Procedure Laterality Date   HERNIA REPAIR      Allergies  Allergies  Allergen Reactions   Tylenol With Codeine #3 [Acetaminophen-Codeine]     History of Present Illness      68 y.o. y/o male with a history of cardiomyopathy/HFmrEF, stroke (May 2024), 50-pack-year history tobacco abuse, obesity, shingles, diabetes, and alcohol use.  Wesley Barry with previous evaluated May 2023 in the setting of dyspnea, chest pain, and palpitations.  A cardiac CTA was ordered however, patient did not follow through.  In May 2024, patient presented to [facility] with right-sided weakness and a code stroke was called.  MRI of the brain showed acute infarct of the left basal ganglia.  He was not a candidate for tPA.  An  echo was performed and showed an EF of 40 to 45% with global hypokinesis, severe LVH, grade 1 diastolic dysfunction, normal RV function, and mild AI.  He was seen by our team and placed on GDMT with recommendation for outpatient workup of cardiomyopathy, following recovery from stroke.  Subsequent outpatient monitoring showed predominantly sinus rhythm with 1 brief run of SVT, and no episodes of A-fib/flutter.     Patient establish care in the outpatient setting in January 2025.  At that time, he was doing well without chest pain or dyspnea, though activity was quite limited.  In the setting of cardiomyopathy with unclear etiology, I arrange for coronary CT angiogram which was subsequently performed and showed significant LAD disease with a FFRct of 0.78.  Since his last visit, he notes that he has been trying to walk more, walking a mile several days per week.  In that setting, he has been experiencing some substernal chest discomfort as well as dyspnea on exertion causing him to stop and rest about every third of a mile.  Symptoms last about 5 minutes and resolve with rest.  He occasionally notes resting angina, which she says occurs when he gets upset about something.  He denies palpitations, PND, orthopnea, dizziness, syncope, edema, or early satiety. Objective  Home Medications    Current Outpatient Medications  Medication Sig Dispense Refill   aspirin EC 81 MG tablet  Take 1 tablet (81 mg total) by mouth daily. Swallow whole. 30 tablet 3   empagliflozin (JARDIANCE) 10 MG TABS tablet Take 1 tablet (10 mg total) by mouth daily. 30 tablet 3   folic acid (FOLVITE) 1 MG tablet Take 1 tablet (1 mg total) by mouth daily. 30 tablet 0   insulin glargine (LANTUS) 100 UNIT/ML injection Inject 0.15 mLs (15 Units total) into the skin at bedtime. 10 mL 3   metoprolol succinate (TOPROL-XL) 50 MG 24 hr tablet Take 1 tablet (50 mg total) by mouth daily. Take with or immediately following a meal. 30 tablet 3    nitroGLYCERIN (NITROSTAT) 0.4 MG SL tablet Place 1 tablet (0.4 mg total) under the tongue every 5 (five) minutes as needed for chest pain. 25 tablet 1   rosuvastatin (CRESTOR) 40 MG tablet Take 40 mg by mouth daily.     sacubitril-valsartan (ENTRESTO) 24-26 MG Take 1 tablet by mouth 2 (two) times daily. 180 tablet 3   clopidogrel (PLAVIX) 75 MG tablet Take 1 tablet (75 mg total) by mouth daily. (Patient not taking: Reported on 04/22/2023) 30 tablet 3   No current facility-administered medications for this visit.     Family History    Family History  Problem Relation Age of Onset   Heart attack Mother 38   Heart attack Father 68   He indicated that his mother is deceased. He indicated that his father is deceased.   Social History    Social History   Socioeconomic History   Marital status: Married    Spouse name: Not on file   Number of children: Not on file   Years of education: Not on file   Highest education level: Not on file  Occupational History   Not on file  Tobacco Use   Smoking status: Former    Current packs/day: 0.00    Average packs/day: 2.0 packs/day for 25.0 years (50.0 ttl pk-yrs)    Types: Cigarettes    Start date: 27    Quit date: 2004    Years since quitting: 21.2   Smokeless tobacco: Not on file  Substance and Sexual Activity   Alcohol use: Yes    Alcohol/week: 18.0 standard drinks of alcohol    Types: 18 Cans of beer per week   Drug use: Never   Sexual activity: Not on file  Other Topics Concern   Not on file  Social History Narrative   Not on file   Social Drivers of Health   Financial Resource Strain: Not on file  Food Insecurity: No Food Insecurity (06/17/2022)   Hunger Vital Sign    Worried About Running Out of Food in the Last Year: Never true    Ran Out of Food in the Last Year: Never true  Transportation Needs: No Transportation Needs (06/17/2022)   PRAPARE - Administrator, Civil Service (Medical): No    Lack of  Transportation (Non-Medical): No  Physical Activity: Not on file  Stress: Not on file  Social Connections: Not on file  Intimate Partner Violence: Not At Risk (06/17/2022)   Humiliation, Afraid, Rape, and Kick questionnaire    Fear of Current or Ex-Partner: No    Emotionally Abused: No    Physically Abused: No    Sexually Abused: No     Review of Systems    General:  No chills, fever, night sweats or weight changes.  Cardiovascular:  +++ chest pain, +++ dyspnea on exertion, no edema, orthopnea, palpitations, paroxysmal  nocturnal dyspnea. Dermatological: No rash, lesions/masses Respiratory: No cough, +++ dyspnea Urologic: No hematuria, dysuria Abdominal:   No nausea, vomiting, diarrhea, bright red blood per rectum, melena, or hematemesis Neurologic:  No visual changes, +++ RUE wkns, no changes in mental status. All other systems reviewed and are otherwise negative except as noted above.     Physical Exam    VS:  BP 120/62 (BP Location: Left Arm, Patient Position: Sitting, Cuff Size: Normal)   Pulse 74   Ht 5\' 9"  (1.753 m)   Wt 204 lb 2 oz (92.6 kg)   SpO2 97%   BMI 30.14 kg/m  , BMI Body mass index is 30.14 kg/m.     GEN: Well nourished, well developed, in no acute distress. HEENT: normal. Neck: Supple, no JVD, carotid bruits, or masses. Cardiac: RRR, no murmurs, rubs, or gallops. No clubbing, cyanosis, edema.  Radials 2+/PT 2+ and equal bilaterally.  Respiratory:  Respirations regular and unlabored, clear to auscultation bilaterally. GI: Soft, nontender, nondistended, BS + x 4. MS: no deformity or atrophy. Skin: warm and dry, no rash. Neuro: Right upper extremity weakness. Psych: Normal affect.  Accessory Clinical Findings    ECG personally reviewed by me today- EKG Interpretation Date/Time:  Thursday April 22 2023 13:44:54 EDT Ventricular Rate:  74 PR Interval:  152 QRS Duration:  108 QT Interval:  412 QTC Calculation: 457 R Axis:   -32  Text  Interpretation: Normal sinus rhythm Left axis deviation Confirmed by Nicolasa Ducking [phone]) on 04/22/2023 2:11:43 PM  - No acute changes  Lab Results  Component Value Date   WBC 7.7 06/19/2022   HGB 16.1 06/19/2022   HCT 50.5 06/19/2022   MCV 90.7 06/19/2022   PLT 211 06/19/2022   Lab Results  Component Value Date   CREATININE 0.90 04/12/2023   BUN 9 02/25/2023   NA 141 02/25/2023   K 4.8 02/25/2023   CL 101 02/25/2023   CO2 23 02/25/2023   Lab Results  Component Value Date   ALT 17 02/25/2023   AST 15 02/25/2023   ALKPHOS 39 (L) 02/25/2023   BILITOT 0.4 02/25/2023   Lab Results  Component Value Date   CHOL 189 02/25/2023   HDL 41 02/25/2023   LDLCALC 132 (H) 02/25/2023   TRIG 86 02/25/2023   CHOLHDL 4.6 02/25/2023    Lab Results  Component Value Date   HGBA1C 11.5 (H) 06/16/2022      Assessment & Plan   1.  Unstable angina/coronary artery disease: Patient with history of cardiomyopathy of previously unclear etiology.  He recently underwent coronary CT angiogram which showed significant LAD stenosis with abnormal FFR CT of 0.78.  Markedly elevated coronary calcium score of 2282 (95th percentile).  Since his last visit here in January, he has been walking more and in that setting, has been noticing dyspnea on exertion as well as substernal chest heaviness that resolves with rest.  We discussed the results of his CT and his symptoms today, and we will plan on diagnostic catheterization.  The patient understands that risks include but are not limited to stroke (1 in 1000), death (1 in 1000), kidney failure [usually temporary] (1 in 500), bleeding (1 in 200), allergic reaction [possibly serious] (1 in 200), and agrees to proceed.  Follow-up CBC and basic metabolic panel today.  Continue aspirin, clopidogrel, metoprolol succinate, and statin therapy.  Adding sublingual nitroglycerin.  He will hold his Jardiance for 3 days prior to cath.  2.  Cardiomyopathy/heart failure  midrange ejection fraction: EF of 40 to 45% by echocardiogram in May 2024 with global hypokinesis, severe LVH, grade 1 diastolic dysfunction, normal RV function, and mild AI.  He has been on GDMT with Toprol-XL, Entresto, and Jardiance.  Recent coronary CT angiogram with significant LAD disease and as above, we will arrange for diagnostic catheterization.  He is euvolemic on examination today.  Continue current regimen though we will plan to hold the Jardiance for 3 days prior to cath.  3.  Hyperlipidemia: Currently on rosuvastatin 40 mg.  LDL was elevated at 132 in January, at which time he was on atorvastatin 80 mg.  I suspect he will need a PCSK9 inhibitor and we can discuss this at follow-up visit post catheterization.  4.  History of stroke: Chronic right upper extremity hemiplegia.  Prior event monitoring did not show any A-fib/flutter.  He remains on aspirin and statin.  He was previously on clopidogrel but ran out of this and in light of CAD and likely need for PCI, I have refilled.  5.  Prior alcohol and tobacco abuse: Patient has not used alcohol or tobacco since his stroke in 2024.  6.  Type 2 diabetes mellitus: Prior A1c of 11.5 in May 2024.  He is followed by his primary care provider remains on Lantus and Jardiance.  He will Jardiance precatheterization.  7.  Disposition: Follow-up CBC and basic metabolic panel today.  Nicolasa Ducking, NP 04/22/2023, 3:11 PM

## 2023-04-22 NOTE — Progress Notes (Signed)
 Cardiology Clinic Note   Patient Name: Wesley Barry Date of Encounter: 04/22/2023  Primary Care Provider:  Emogene Morgan, MD Primary Cardiologist:  Wesley Nordmann, MD  Patient Profile    68 y.o. male with a history of cardiomyopathy/HFmrEF, stroke (May 2024), 50-pack-year history tobacco abuse, obesity, shingles, diabetes, and alcohol use, who presents for follow-up after recent abnormal coronary CT angiogram.  Past Medical History    Past Medical History:  Diagnosis Date   Alcohol abuse    CAD (coronary artery disease)    a. 04/2023 Cor CTA: LAD 50-74m (FFRct 0.78), LCX 25-39m (FFRct 0.97), RCA 25-23m (FFRct 0.93). Cor Ca2+ = 2282 (95th%'ile).   Cardiomyopathy (HCC)    a. 06/2022 Echo: EF 40-45%.  Global HK.  Severe LVH.  Grade 1 DD.  Nl RV fxn. Mild AI.   Heart failure with mid-range ejection fraction [facility])    History of shingles 2021   Morbid obesity (HCC)    Stroke (HCC)    a. 06/2022 MRI: Acute infarct of the left basal ganglia; b. 06/2022 Zio: Predominantly sinus rhythm, average 92 (65-164).  1, 7 beat run of nonsustained SVT.  Rare PACs/PVCs.  No A-fib or sustained arrhythmias.   Tobacco abuse    Type 2 diabetes mellitus (HCC)    Past Surgical History:  Procedure Laterality Date   HERNIA REPAIR      Allergies  Allergies  Allergen Reactions   Tylenol With Codeine #3 [Acetaminophen-Codeine]     History of Present Illness      68 y.o. y/o male with a history of cardiomyopathy/HFmrEF, stroke (May 2024), 50-pack-year history tobacco abuse, obesity, shingles, diabetes, and alcohol use.  Wesley Barry with previous evaluated May 2023 in the setting of dyspnea, chest pain, and palpitations.  A cardiac CTA was ordered however, patient did not follow through.  In May 2024, patient presented to [facility] with right-sided weakness and a code stroke was called.  MRI of the brain showed acute infarct of the left basal ganglia.  He was not a candidate for tPA.  An  echo was performed and showed an EF of 40 to 45% with global hypokinesis, severe LVH, grade 1 diastolic dysfunction, normal RV function, and mild AI.  He was seen by our team and placed on GDMT with recommendation for outpatient workup of cardiomyopathy, following recovery from stroke.  Subsequent outpatient monitoring showed predominantly sinus rhythm with 1 brief run of SVT, and no episodes of A-fib/flutter.     Patient establish care in the outpatient setting in January 2025.  At that time, he was doing well without chest pain or dyspnea, though activity was quite limited.  In the setting of cardiomyopathy with unclear etiology, I arrange for coronary CT angiogram which was subsequently performed and showed significant LAD disease with a FFRct of 0.78.  Since his last visit, he notes that he has been trying to walk more, walking a mile several days per week.  In that setting, he has been experiencing some substernal chest discomfort as well as dyspnea on exertion causing him to stop and rest about every third of a mile.  Symptoms last about 5 minutes and resolve with rest.  He occasionally notes resting angina, which she says occurs when he gets upset about something.  He denies palpitations, PND, orthopnea, dizziness, syncope, edema, or early satiety. Objective  Home Medications    Current Outpatient Medications  Medication Sig Dispense Refill   aspirin EC 81 MG tablet  Take 1 tablet (81 mg total) by mouth daily. Swallow whole. 30 tablet 3   empagliflozin (JARDIANCE) 10 MG TABS tablet Take 1 tablet (10 mg total) by mouth daily. 30 tablet 3   folic acid (FOLVITE) 1 MG tablet Take 1 tablet (1 mg total) by mouth daily. 30 tablet 0   insulin glargine (LANTUS) 100 UNIT/ML injection Inject 0.15 mLs (15 Units total) into the skin at bedtime. 10 mL 3   metoprolol succinate (TOPROL-XL) 50 MG 24 hr tablet Take 1 tablet (50 mg total) by mouth daily. Take with or immediately following a meal. 30 tablet 3    nitroGLYCERIN (NITROSTAT) 0.4 MG SL tablet Place 1 tablet (0.4 mg total) under the tongue every 5 (five) minutes as needed for chest pain. 25 tablet 1   rosuvastatin (CRESTOR) 40 MG tablet Take 40 mg by mouth daily.     sacubitril-valsartan (ENTRESTO) 24-26 MG Take 1 tablet by mouth 2 (two) times daily. 180 tablet 3   clopidogrel (PLAVIX) 75 MG tablet Take 1 tablet (75 mg total) by mouth daily. (Patient not taking: Reported on 04/22/2023) 30 tablet 3   No current facility-administered medications for this visit.     Family History    Family History  Problem Relation Age of Onset   Heart attack Mother 38   Heart attack Father 68   He indicated that his mother is deceased. He indicated that his father is deceased.   Social History    Social History   Socioeconomic History   Marital status: Married    Spouse name: Not on file   Number of children: Not on file   Years of education: Not on file   Highest education level: Not on file  Occupational History   Not on file  Tobacco Use   Smoking status: Former    Current packs/day: 0.00    Average packs/day: 2.0 packs/day for 25.0 years (50.0 ttl pk-yrs)    Types: Cigarettes    Start date: 27    Quit date: 2004    Years since quitting: 21.2   Smokeless tobacco: Not on file  Substance and Sexual Activity   Alcohol use: Yes    Alcohol/week: 18.0 standard drinks of alcohol    Types: 18 Cans of beer per week   Drug use: Never   Sexual activity: Not on file  Other Topics Concern   Not on file  Social History Narrative   Not on file   Social Drivers of Health   Financial Resource Strain: Not on file  Food Insecurity: No Food Insecurity (06/17/2022)   Hunger Vital Sign    Worried About Running Out of Food in the Last Year: Never true    Ran Out of Food in the Last Year: Never true  Transportation Needs: No Transportation Needs (06/17/2022)   PRAPARE - Administrator, Civil Service (Medical): No    Lack of  Transportation (Non-Medical): No  Physical Activity: Not on file  Stress: Not on file  Social Connections: Not on file  Intimate Partner Violence: Not At Risk (06/17/2022)   Humiliation, Afraid, Rape, and Kick questionnaire    Fear of Current or Ex-Partner: No    Emotionally Abused: No    Physically Abused: No    Sexually Abused: No     Review of Systems    General:  No chills, fever, night sweats or weight changes.  Cardiovascular:  +++ chest pain, +++ dyspnea on exertion, no edema, orthopnea, palpitations, paroxysmal  nocturnal dyspnea. Dermatological: No rash, lesions/masses Respiratory: No cough, +++ dyspnea Urologic: No hematuria, dysuria Abdominal:   No nausea, vomiting, diarrhea, bright red blood per rectum, melena, or hematemesis Neurologic:  No visual changes, +++ RUE wkns, no changes in mental status. All other systems reviewed and are otherwise negative except as noted above.     Physical Exam    VS:  BP 120/62 (BP Location: Left Arm, Patient Position: Sitting, Cuff Size: Normal)   Pulse 74   Ht 5\' 9"  (1.753 m)   Wt 204 lb 2 oz (92.6 kg)   SpO2 97%   BMI 30.14 kg/m  , BMI Body mass index is 30.14 kg/m.     GEN: Well nourished, well developed, in no acute distress. HEENT: normal. Neck: Supple, no JVD, carotid bruits, or masses. Cardiac: RRR, no murmurs, rubs, or gallops. No clubbing, cyanosis, edema.  Radials 2+/PT 2+ and equal bilaterally.  Respiratory:  Respirations regular and unlabored, clear to auscultation bilaterally. GI: Soft, nontender, nondistended, BS + x 4. MS: no deformity or atrophy. Skin: warm and dry, no rash. Neuro: Right upper extremity weakness. Psych: Normal affect.  Accessory Clinical Findings    ECG personally reviewed by me today- EKG Interpretation Date/Time:  Thursday April 22 2023 13:44:54 EDT Ventricular Rate:  74 PR Interval:  152 QRS Duration:  108 QT Interval:  412 QTC Calculation: 457 R Axis:   -32  Text  Interpretation: Normal sinus rhythm Left axis deviation Confirmed by Nicolasa Ducking [phone]) on 04/22/2023 2:11:43 PM  - No acute changes  Lab Results  Component Value Date   WBC 7.7 06/19/2022   HGB 16.1 06/19/2022   HCT 50.5 06/19/2022   MCV 90.7 06/19/2022   PLT 211 06/19/2022   Lab Results  Component Value Date   CREATININE 0.90 04/12/2023   BUN 9 02/25/2023   NA 141 02/25/2023   K 4.8 02/25/2023   CL 101 02/25/2023   CO2 23 02/25/2023   Lab Results  Component Value Date   ALT 17 02/25/2023   AST 15 02/25/2023   ALKPHOS 39 (L) 02/25/2023   BILITOT 0.4 02/25/2023   Lab Results  Component Value Date   CHOL 189 02/25/2023   HDL 41 02/25/2023   LDLCALC 132 (H) 02/25/2023   TRIG 86 02/25/2023   CHOLHDL 4.6 02/25/2023    Lab Results  Component Value Date   HGBA1C 11.5 (H) 06/16/2022      Assessment & Plan   1.  Unstable angina/coronary artery disease: Patient with history of cardiomyopathy of previously unclear etiology.  He recently underwent coronary CT angiogram which showed significant LAD stenosis with abnormal FFR CT of 0.78.  Markedly elevated coronary calcium score of 2282 (95th percentile).  Since his last visit here in January, he has been walking more and in that setting, has been noticing dyspnea on exertion as well as substernal chest heaviness that resolves with rest.  We discussed the results of his CT and his symptoms today, and we will plan on diagnostic catheterization.  The patient understands that risks include but are not limited to stroke (1 in 1000), death (1 in 1000), kidney failure [usually temporary] (1 in 500), bleeding (1 in 200), allergic reaction [possibly serious] (1 in 200), and agrees to proceed.  Follow-up CBC and basic metabolic panel today.  Continue aspirin, clopidogrel, metoprolol succinate, and statin therapy.  Adding sublingual nitroglycerin.  He will hold his Jardiance for 3 days prior to cath.  2.  Cardiomyopathy/heart failure  midrange ejection fraction: EF of 40 to 45% by echocardiogram in May 2024 with global hypokinesis, severe LVH, grade 1 diastolic dysfunction, normal RV function, and mild AI.  He has been on GDMT with Toprol-XL, Entresto, and Jardiance.  Recent coronary CT angiogram with significant LAD disease and as above, we will arrange for diagnostic catheterization.  He is euvolemic on examination today.  Continue current regimen though we will plan to hold the Jardiance for 3 days prior to cath.  3.  Hyperlipidemia: Currently on rosuvastatin 40 mg.  LDL was elevated at 132 in January, at which time he was on atorvastatin 80 mg.  I suspect he will need a PCSK9 inhibitor and we can discuss this at follow-up visit post catheterization.  4.  History of stroke: Chronic right upper extremity hemiplegia.  Prior event monitoring did not show any A-fib/flutter.  He remains on aspirin and statin.  He was previously on clopidogrel but ran out of this and in light of CAD and likely need for PCI, I have refilled.  5.  Prior alcohol and tobacco abuse: Patient has not used alcohol or tobacco since his stroke in 2024.  6.  Type 2 diabetes mellitus: Prior A1c of 11.5 in May 2024.  He is followed by his primary care provider remains on Lantus and Jardiance.  He will Jardiance precatheterization.  7.  Disposition: Follow-up CBC and basic metabolic panel today.  Nicolasa Ducking, NP 04/22/2023, 3:11 PM

## 2023-04-22 NOTE — Patient Instructions (Signed)
 Medication Instructions:  Take Nitroglycerin as needed for chest pain: Place one tablet under the tongue as needed every 5 minutes for chest pain. If you have to use three tablets with no relief, please call 911 or go to your closest Emergency Department.  *If you need a refill on your cardiac medications before your next appointment, please call your pharmacy*   Lab Work: Your provider would like for you to have the following labs today: CBC and BMET  If you have labs (blood work) drawn today and your tests are completely normal, you will receive your results only by: MyChart Message (if you have MyChart) OR A paper copy in the mail If you have any lab test that is abnormal or we need to change your treatment, we will call you to review the results.   Testing/Procedures: Your physician has requested that you have a cardiac catheterization. Cardiac catheterization is used to diagnose and/or treat various heart conditions. Doctors may recommend this procedure for a number of different reasons. The most common reason is to evaluate chest pain. Chest pain can be a symptom of coronary artery disease (CAD), and cardiac catheterization can show whether plaque is narrowing or blocking your heart's arteries. This procedure is also used to evaluate the valves, as well as measure the blood flow and oxygen levels in different parts of your heart. For further information please visit [email or web address] Please follow instruction sheet, as given.    Follow-Up: At [facility], you and your health needs are our priority.  As part of our continuing mission to provide you with exceptional heart care, we have created designated Provider Care Teams.  These Care Teams include your primary Cardiologist (physician) and Advanced Practice Providers (APPs -  Physician Assistants and Nurse Practitioners) who all work together to provide you with the care you need, when you need it.  We recommend signing up  for the patient portal called "MyChart".  Sign up information is provided on this After Visit Summary.  MyChart is used to connect with patients for Virtual Visits (Telemedicine).  Patients are able to view lab/test results, encounter notes, upcoming appointments, etc.  Non-urgent messages can be sent to your provider as well.   To learn more about what you can do with MyChart, go to [email or web address].    Your next appointment:   3 week(s)  Provider:   Julien Nordmann, MD or Nicolasa Ducking, NP    Other Instructions  [facility] A DEPT OF [facility] AT [facility] [address] [place], [address] [place] [address] Dept: [phone] Loc: [phone]  PASCAL STIGGERS  04/22/2023  You are scheduled for a Cardiac Catheterization on Thursday, March 27 with Dr. Bryan Lemma.  1. Please arrive at the [facility] Entrance of [facility], [address], [place] [address] at 10:30 (This is 1 hour(s) prior to your procedure time).  Proceed to the Check-In Desk directly inside the entrance.  Procedure Parking: Use the entrance off of the [address] side of the hospital. Turn right upon entering and follow the driveway to parking that is directly in front of the Heart & Vascular Center. There is no valet parking available at this entrance, however there is an awning directly in front of the Heart & Vascular Center for drop off/ pick up for patients.  Special note: Every effort is made to have your procedure done on time. Please understand that emergencies sometimes delay scheduled  procedures.  2. Diet: Do not eat solid foods after midnight.  The patient may have clear liquids until 5am upon the day of the procedure.  3. Labs: You will need to have blood drawn on 04/22/23. You do not need to be fasting.  4. Medication instructions in preparation for your procedure: Hold all diabetic medication the morning of the  cath  -take half the dose of the Lantus the night before the cath  -hold the Jardiance the morning of the cath  On the morning of your procedure, take your Aspirin 81 mg and Plavix/Clopidogrel and any morning medicines NOT listed above.  You may use sips of water.  5. Plan to go home the same day, you will only stay overnight if medically necessary. 6. Bring a current list of your medications and current insurance cards. 7. You MUST have a responsible person to drive you home. 8. Someone MUST be with you the first 24 hours after you arrive home or your discharge will be delayed. 9. Please wear clothes that are easy to get on and off and wear slip-on shoes.  Thank you for allowing [place] to care for you!   -- [facility]

## 2023-04-23 LAB — CBC
Hematocrit: 45.5 % (ref 37.5–51.0)
Hemoglobin: 15 g/dL (ref 13.0–17.7)
MCH: 28.2 pg (ref 26.6–33.0)
MCHC: 33 g/dL (ref 31.5–35.7)
MCV: 86 fL (ref 79–97)
Platelets: 277 10*3/uL (ref 150–450)
RBC: 5.32 x10E6/uL (ref 4.14–5.80)
RDW: 12.4 % (ref 11.6–15.4)
WBC: 7.1 10*3/uL (ref 3.4–10.8)

## 2023-04-23 LAB — BASIC METABOLIC PANEL
BUN/Creatinine Ratio: 12 (ref 10–24)
BUN: 10 mg/dL (ref 8–27)
CO2: 23 mmol/L (ref 20–29)
Calcium: 9.1 mg/dL (ref 8.6–10.2)
Chloride: 103 mmol/L (ref 96–106)
Creatinine, Ser: 0.85 mg/dL (ref 0.76–1.27)
Glucose: 118 mg/dL — ABNORMAL HIGH (ref 70–99)
Potassium: 4.3 mmol/L (ref 3.5–5.2)
Sodium: 141 mmol/L (ref 134–144)
eGFR: 94 mL/min/{1.73_m2} (ref >59)

## 2023-04-29 ENCOUNTER — Encounter: Payer: Self-pay | Admitting: Cardiology

## 2023-04-29 ENCOUNTER — Encounter
Admission: RE | Disposition: A | Discharge status: Home / Self Care | Payer: Self-pay | Source: Home / Self Care | Attending: Cardiology

## 2023-04-29 ENCOUNTER — Ambulatory Visit
Admission: RE | Admit: 2023-04-29 | Discharge: 2023-04-29 | Disposition: A | Discharge status: Home / Self Care | Attending: Cardiology | Admitting: Cardiology

## 2023-04-29 ENCOUNTER — Other Ambulatory Visit: Payer: Self-pay

## 2023-04-29 ENCOUNTER — Ambulatory Visit: Payer: Medicare HMO | Admitting: Nurse Practitioner

## 2023-04-29 DIAGNOSIS — Z8619 Personal history of other infectious and parasitic diseases: Secondary | ICD-10-CM | POA: Diagnosis not present

## 2023-04-29 DIAGNOSIS — Z8249 Family history of ischemic heart disease and other diseases of the circulatory system: Secondary | ICD-10-CM | POA: Diagnosis not present

## 2023-04-29 DIAGNOSIS — I209 Angina pectoris, unspecified: Secondary | ICD-10-CM | POA: Diagnosis present

## 2023-04-29 DIAGNOSIS — Z79899 Other long term (current) drug therapy: Secondary | ICD-10-CM | POA: Insufficient documentation

## 2023-04-29 DIAGNOSIS — Z7982 Long term (current) use of aspirin: Secondary | ICD-10-CM | POA: Diagnosis not present

## 2023-04-29 DIAGNOSIS — I69351 Hemiplegia and hemiparesis following cerebral infarction affecting right dominant side: Secondary | ICD-10-CM | POA: Diagnosis not present

## 2023-04-29 DIAGNOSIS — I25119 Atherosclerotic heart disease of native coronary artery with unspecified angina pectoris: Secondary | ICD-10-CM | POA: Insufficient documentation

## 2023-04-29 DIAGNOSIS — I429 Cardiomyopathy, unspecified: Secondary | ICD-10-CM | POA: Insufficient documentation

## 2023-04-29 DIAGNOSIS — I2584 Coronary atherosclerosis due to calcified coronary lesion: Secondary | ICD-10-CM | POA: Insufficient documentation

## 2023-04-29 DIAGNOSIS — R931 Abnormal findings on diagnostic imaging of heart and coronary circulation: Secondary | ICD-10-CM | POA: Diagnosis not present

## 2023-04-29 DIAGNOSIS — Z87891 Personal history of nicotine dependence: Secondary | ICD-10-CM | POA: Diagnosis not present

## 2023-04-29 DIAGNOSIS — I200 Unstable angina: Secondary | ICD-10-CM

## 2023-04-29 DIAGNOSIS — Z7902 Long term (current) use of antithrombotics/antiplatelets: Secondary | ICD-10-CM | POA: Diagnosis not present

## 2023-04-29 DIAGNOSIS — I5022 Chronic systolic (congestive) heart failure: Secondary | ICD-10-CM | POA: Diagnosis not present

## 2023-04-29 DIAGNOSIS — Z794 Long term (current) use of insulin: Secondary | ICD-10-CM | POA: Insufficient documentation

## 2023-04-29 DIAGNOSIS — E119 Type 2 diabetes mellitus without complications: Secondary | ICD-10-CM | POA: Insufficient documentation

## 2023-04-29 HISTORY — PX: LEFT HEART CATH AND CORONARY ANGIOGRAPHY: CATH118249

## 2023-04-29 LAB — GLUCOSE, CAPILLARY: Glucose-Capillary: 120 mg/dL — ABNORMAL HIGH (ref 70–99)

## 2023-04-29 SURGERY — LEFT HEART CATH AND CORONARY ANGIOGRAPHY
Anesthesia: Moderate Sedation | Laterality: Left

## 2023-04-29 MED ORDER — VERAPAMIL HCL 2.5 MG/ML IV SOLN
INTRAVENOUS | Status: AC
  Filled 2023-04-29: qty 2

## 2023-04-29 MED ORDER — HYDRALAZINE HCL 20 MG/ML IJ SOLN: 10.0000 mg | INTRAMUSCULAR | Status: DC | PRN

## 2023-04-29 MED ORDER — MIDAZOLAM HCL 2 MG/2ML IJ SOLN
INTRAMUSCULAR | Status: DC | PRN
  Administered 2023-04-29: 2 mg via INTRAVENOUS

## 2023-04-29 MED ORDER — LABETALOL HCL 5 MG/ML IV SOLN: 10.0000 mg | INTRAVENOUS | Status: DC | PRN

## 2023-04-29 MED ORDER — LIDOCAINE HCL (PF) 1 % IJ SOLN
INTRAMUSCULAR | Status: DC | PRN
  Administered 2023-04-29: 2 mL

## 2023-04-29 MED ORDER — SODIUM CHLORIDE 0.9 % WEIGHT BASED INFUSION: 1.0000 mL/kg/h | INTRAVENOUS | Status: DC

## 2023-04-29 MED ORDER — SODIUM CHLORIDE 0.9% FLUSH: 3.0000 mL | INTRAVENOUS | Status: DC | PRN

## 2023-04-29 MED ORDER — HEPARIN (PORCINE) IN NACL 2000-0.9 UNIT/L-% IV SOLN
INTRAVENOUS | Status: DC | PRN
  Administered 2023-04-29: 1000 mL

## 2023-04-29 MED ORDER — SODIUM CHLORIDE 0.9 % IV SOLN: 250.0000 mL | INTRAVENOUS | Status: DC | PRN

## 2023-04-29 MED ORDER — HEPARIN SODIUM (PORCINE) 1000 UNIT/ML IJ SOLN
INTRAMUSCULAR | Status: DC | PRN
  Administered 2023-04-29: 4500 [IU] via INTRAVENOUS

## 2023-04-29 MED ORDER — SODIUM CHLORIDE 0.9 % IV SOLN: INTRAVENOUS | Status: DC

## 2023-04-29 MED ORDER — ONDANSETRON HCL 4 MG/2ML IJ SOLN: 4.0000 mg | Freq: Four times a day (QID) | INTRAMUSCULAR | Status: DC | PRN

## 2023-04-29 MED ORDER — HEPARIN SODIUM (PORCINE) 1000 UNIT/ML IJ SOLN
INTRAMUSCULAR | Status: AC
  Filled 2023-04-29: qty 10

## 2023-04-29 MED ORDER — FENTANYL CITRATE (PF) 100 MCG/2ML IJ SOLN
INTRAMUSCULAR | Status: AC
  Filled 2023-04-29: qty 2

## 2023-04-29 MED ORDER — NITROGLYCERIN 1 MG/10 ML FOR IR/CATH LAB
INTRA_ARTERIAL | Status: DC | PRN
  Administered 2023-04-29: 200 ug via INTRA_ARTERIAL

## 2023-04-29 MED ORDER — ASPIRIN 81 MG PO CHEW
81.0000 mg | CHEWABLE_TABLET | ORAL | Status: AC
  Administered 2023-04-29: 81 mg via ORAL

## 2023-04-29 MED ORDER — NITROGLYCERIN 1 MG/10 ML FOR IR/CATH LAB
INTRA_ARTERIAL | Status: AC
  Filled 2023-04-29: qty 10

## 2023-04-29 MED ORDER — ASPIRIN 81 MG PO CHEW
CHEWABLE_TABLET | ORAL | Status: AC
  Filled 2023-04-29: qty 1

## 2023-04-29 MED ORDER — IOHEXOL 300 MG/ML  SOLN
INTRAMUSCULAR | Status: DC | PRN
Start: 2023-04-29 — End: 2023-04-29
  Administered 2023-04-29: 99 mL

## 2023-04-29 MED ORDER — CLOPIDOGREL BISULFATE 75 MG PO TABS: 75.0000 mg | ORAL_TABLET | Freq: Every day | ORAL | 6 refills | Status: AC

## 2023-04-29 MED ORDER — ISOSORBIDE MONONITRATE ER 30 MG PO TB24: 30.0000 mg | ORAL_TABLET | Freq: Every day | ORAL | 11 refills | Status: DC

## 2023-04-29 MED ORDER — FENTANYL CITRATE (PF) 100 MCG/2ML IJ SOLN
INTRAMUSCULAR | Status: DC | PRN
  Administered 2023-04-29: 25 ug via INTRAVENOUS

## 2023-04-29 MED ORDER — MIDAZOLAM HCL 2 MG/2ML IJ SOLN
INTRAMUSCULAR | Status: AC
  Filled 2023-04-29: qty 2

## 2023-04-29 MED ORDER — SODIUM CHLORIDE 0.9% FLUSH: 3.0000 mL | Freq: Two times a day (BID) | INTRAVENOUS | Status: DC

## 2023-04-29 MED ORDER — VERAPAMIL HCL 2.5 MG/ML IV SOLN
INTRAVENOUS | Status: DC | PRN
Start: 2023-04-29 — End: 2023-04-29
  Administered 2023-04-29: 2.5 mg via INTRA_ARTERIAL

## 2023-04-29 SURGICAL SUPPLY — 12 items
CATH 5F 110X4 TIG (CATHETERS) IMPLANT
CATH 5FR JL3.5 JR4 ANG PIG MP (CATHETERS) IMPLANT
CATH LAUNCHER 6FR EBU3.5 (CATHETERS) IMPLANT
DEVICE RAD TR BAND REGULAR (VASCULAR PRODUCTS) IMPLANT
DRAPE BRACHIAL (DRAPES) IMPLANT
GLIDESHEATH SLEND SS 6F .021 (SHEATH) IMPLANT
GUIDEWIRE INQWIRE 1.5J.035X260 (WIRE) IMPLANT
INQWIRE 1.5J .035X260CM (WIRE) ×1 IMPLANT
PACK CARDIAC CATH (CUSTOM PROCEDURE TRAY) ×1 IMPLANT
PROTECTION STATION PRESSURIZED (MISCELLANEOUS) ×1 IMPLANT
SET ATX-X65L (MISCELLANEOUS) IMPLANT
STATION PROTECTION PRESSURIZED (MISCELLANEOUS) IMPLANT

## 2023-04-29 NOTE — Interval H&P Note (Signed)
 History and Physical Interval Note:  04/29/2023 11:40 AM  Wesley Barry  has presented today for surgery, with the diagnosis of L Cath   Abnormal cardiac CT angiogram.  The various methods of treatment have been discussed with the patient and family. After consideration of risks, benefits and other options for treatment, the patient has consented to  Procedure(s): LEFT HEART CATH AND CORONARY ANGIOGRAPHY (Left)  PERCUTANEOUS CORONARY INTERVENTION  as a surgical intervention.  The patient's history has been reviewed, patient examined, no change in status, stable for surgery.  I have reviewed the patient's chart and labs.  Questions were answered to the patient's satisfaction.    Cath Lab Visit (complete for each Cath Lab visit)  Clinical Evaluation Leading to the Procedure:   ACS: No.  Non-ACS:    Anginal Classification: CCS III  Anti-ischemic medical therapy: Maximal Therapy (2 or more classes of medications)  Non-Invasive Test Results: High-risk stress test findings: cardiac mortality >3%/year  Prior CABG: No previous CABG    Bryan Lemma

## 2023-04-30 ENCOUNTER — Ambulatory Visit: Payer: Self-pay | Admitting: Emergency Medicine

## 2023-04-30 ENCOUNTER — Encounter: Payer: Self-pay | Admitting: Cardiology

## 2023-05-03 NOTE — Heart Team MDD (Signed)
   Heart Team Multi-Disciplinary Discussion  Patient: Wesley Barry  DOB: [birth year]  MRN: [id number]   Date: 05/03/2023  12:56 PM    Attendees: Interventional Cardiology: Yates Decamp, MD Truett Mainland, MD Lorine Bears, MD Bryan Lemma, MD Peter Swaz[place], MD Dorothyann Peng, MD Tonny Bollman, MD  Cardiothoracic Surgery: Brynda Greathouse, MD   Additional Attendees: [facility]   Patient History: 68 y.o. male with a history of cardiomyopathy/HFmrEF, stroke (May 2024), 50-pack-year history tobacco abuse, obesity, shingles, diabetes, and alcohol use, with c/o substernal chest discomfort with dyspnea on exertion.    Risk Factors: History of Stroke or TIA Diabetes Mellitus     Review of Prior Angiography and PCI Procedures: Left coronary angiography and heart cath completed on 04/29/2023 images were reviewed and discussed in detail including: Segmental moderate to severe calcified lesion in the LAD most significantly focal 65% which likely correlates with the CT FFR positive location on Coronary CTA.  Vessel is roughly 2-2.25 mm in diameter and the extent of lesion would be at least 20 to 24 mm at a minimum and a Ossified segment.    Discussion: After presentation, consideration of treatment options occurred contrasting attempted PCI with medical therapy. After extensive discussion, consensus from the team was for medical therapy due to small size of vessel, calcifications present and size of stenosis.    Recommendations: Medical Therapy      Alison Murray, RN  05/03/2023 12:56 PM

## 2023-05-13 ENCOUNTER — Encounter: Payer: Self-pay | Admitting: Nurse Practitioner

## 2023-05-13 ENCOUNTER — Ambulatory Visit: Attending: Nurse Practitioner | Admitting: Nurse Practitioner

## 2023-05-13 VITALS — BP 133/77 | HR 81 | Ht 69.0 in | Wt 203.0 lb

## 2023-05-13 DIAGNOSIS — E785 Hyperlipidemia, unspecified: Secondary | ICD-10-CM

## 2023-05-13 DIAGNOSIS — E1165 Type 2 diabetes mellitus with hyperglycemia: Secondary | ICD-10-CM

## 2023-05-13 DIAGNOSIS — I25119 Atherosclerotic heart disease of native coronary artery with unspecified angina pectoris: Secondary | ICD-10-CM | POA: Diagnosis not present

## 2023-05-13 DIAGNOSIS — I428 Other cardiomyopathies: Secondary | ICD-10-CM | POA: Diagnosis not present

## 2023-05-13 DIAGNOSIS — I5022 Chronic systolic (congestive) heart failure: Secondary | ICD-10-CM

## 2023-05-13 MED ORDER — ISOSORBIDE MONONITRATE ER 60 MG PO TB24: 60.0000 mg | ORAL_TABLET | Freq: Every day | ORAL | 3 refills | Status: AC

## 2023-05-13 NOTE — Progress Notes (Signed)
 Office Visit    Patient Name: Wesley Barry Date of Encounter: 05/13/2023  Primary Care Provider:  Emogene Morgan, MD Primary Cardiologist:  Julien Nordmann, MD  Chief Complaint    68 y.o. 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  Past Medical History  Subjective   Past Medical History:  Diagnosis Date   Alcohol abuse    CAD (coronary artery disease)    a. 04/2023 Cor CTA: LAD 50-43m (FFRct 0.78), LCX 25-76m (FFRct 0.97), RCA 25-47m (FFRct 0.93). Cor Ca2+ = 2282 (95th%'ile); b. 04/2023 Cath: LM nl, LAD 45/65/55 mid - tandem lesions, D1/2/3 small, LCX nl, OM1 nl, RCA large, nl, RPAV/RPL nl, EF 45-50%.   Cardiomyopathy (HCC)    a. 06/2022 Echo: EF 40-45%.  Global HK.  Severe LVH.  Grade 1 DD.  Nl RV fxn. Mild AI.   Heart failure with mid-range ejection fraction [facility])    History of shingles 2021   Morbid obesity (HCC)    Stroke (HCC)    a. 06/2022 MRI: Acute infarct of the left basal ganglia; b. 06/2022 Zio: Predominantly sinus rhythm, average 92 (65-164).  1, 7 beat run of nonsustained SVT.  Rare PACs/PVCs.  No A-fib or sustained arrhythmias.   Tobacco abuse    Type 2 diabetes mellitus [facility])    Past Surgical History:  Procedure Laterality Date   HERNIA REPAIR     LEFT HEART CATH AND CORONARY ANGIOGRAPHY Left 04/29/2023   Procedure: LEFT HEART CATH AND CORONARY ANGIOGRAPHY;  Surgeon: Marykay Lex, MD;  Location: [facility] INVASIVE CV LAB;  Service: Cardiovascular;  Laterality: Left;    Allergies  Allergies  Allergen Reactions   Tylenol With Codeine #3 [Acetaminophen-Codeine] Shortness Of Breath    Codeine       History of Present Illness      68 y.o. y/o male with a history of nonobstructive CAD, nonischemic  cardiomyopathy/HFmrEF, stroke (May 2024), 50-pack-year history tobacco abuse, obesity, shingles, diabetes, and alcohol use.  Wesley Barry was previously evaluated in May 2023 in the setting of dyspnea, chest pain, and palpitations.  A cardiac CTA was ordered however, patient did not follow through.  In May 2024, patient presented to [facility] regional with right-sided weakness and a code stroke was called.  MRI of the brain showed acute infarct of the left basal ganglia.  He was not a candidate for tPA.  An echo was performed and showed an EF of 40 to 45% with global hypokinesis, severe LVH, grade 1 diastolic dysfunction, normal RV function, and mild AI.  He was seen by our team and placed on GDMT with recommendation for outpatient workup of cardiomyopathy, following recovery from stroke.  Subsequent outpatient monitoring showed predominantly sinus rhythm with 1 brief run of SVT, and no episodes of A-fib/flutter.  After missing several appointments, Wesley Barry follow-up in January 2025 at which time he was doing well without chest pain or dyspnea, though activity was limited.  In the setting of prior diagnosis of cardiomyopathy, coronary CT angiogram was undertaken and showed significant LAD disease with FFR CT of 0.78.  He was seen back in clinic in March 2025 and set up for diagnostic catheterization, which showed tandem moderate stenoses in the LAD of 45, 65, and 55% with otherwise nonobstructive disease.  Recommendation was made for medical therapy and  he was placed on long-acting nitrate.      Since his cath, he has continued to have dyspnea on exertion and higher levels of activity.  He has had a few episodes of chest pain, though these are somewhat atypical, occurring at rest, lasting less than 1 minute, and resolving spontaneously.  He has not had any exertional chest pain.  He remains bothered by his dyspnea.  He denies palpitations, PND, orthopnea, dizziness, syncope, edema, or early  satiety. Objective  Home Medications    Current Outpatient Medications  Medication Sig Dispense Refill   aspirin EC 81 MG tablet Take 1 tablet (81 mg total) by mouth daily. Swallow whole. 30 tablet 3   clopidogrel (PLAVIX) 75 MG tablet Take 1 tablet (75 mg total) by mouth daily. 30 tablet 6   empagliflozin (JARDIANCE) 10 MG TABS tablet Take 1 tablet (10 mg total) by mouth daily. 30 tablet 3   folic acid (FOLVITE) 1 MG tablet Take 1 tablet (1 mg total) by mouth daily. 30 tablet 0   isosorbide mononitrate (IMDUR) 60 MG 24 hr tablet Take 1 tablet (60 mg total) by mouth daily. 90 tablet 3   metoprolol succinate (TOPROL-XL) 50 MG 24 hr tablet Take 1 tablet (50 mg total) by mouth daily. Take with or immediately following a meal. 30 tablet 3   nitroGLYCERIN (NITROSTAT) 0.4 MG SL tablet Place 1 tablet (0.4 mg total) under the tongue every 5 (five) minutes as needed for chest pain. 25 tablet 1   rosuvastatin (CRESTOR) 40 MG tablet Take 40 mg by mouth daily.     sacubitril-valsartan (ENTRESTO) 24-26 MG Take 1 tablet by mouth 2 (two) times daily. 180 tablet 3   insulin glargine (LANTUS) 100 UNIT/ML injection Inject 0.15 mLs (15 Units total) into the skin at bedtime. (Patient not taking: Reported on 04/26/2023) 10 mL 3   No current facility-administered medications for this visit.     Physical Exam    VS:  BP 133/77   Pulse 81   Ht 5\' 9"  (1.753 m)   Wt 203 lb (92.1 kg)   SpO2 95%   BMI 29.98 kg/m  , BMI Body mass index is 29.98 kg/m.       GEN: Well nourished, well developed, in no acute distress. HEENT: normal. Neck: Supple, no JVD, carotid bruits, or masses. Cardiac: RRR, no murmurs, rubs, or gallops. No clubbing, cyanosis, edema.  Radials 2+/PT 2+ and equal bilaterally.  Right radial catheterization site without bleeding, bruit, or hematoma. Respiratory:  Respirations regular and unlabored, clear to auscultation bilaterally. GI: Soft, nontender, nondistended, BS + x 4. MS: no deformity or  atrophy. Skin: warm and dry, no rash. Neuro:  RUE wkns. Psych: Normal affect.  Accessory Clinical Findings    Lab Results  Component Value Date   WBC 7.1 04/22/2023   HGB 15.0 04/22/2023   HCT 45.5 04/22/2023   MCV 86 04/22/2023   PLT 277 04/22/2023   Lab Results  Component Value Date   CREATININE 0.85 04/22/2023   BUN 10 04/22/2023   NA 141 04/22/2023   K 4.3 04/22/2023   CL 103 04/22/2023   CO2 23 04/22/2023   Lab Results  Component Value Date   ALT 17 02/25/2023   AST 15 02/25/2023   ALKPHOS 39 (L) 02/25/2023   BILITOT 0.4 02/25/2023   Lab Results  Component Value Date   CHOL 189 02/25/2023   HDL 41 02/25/2023   LDLCALC 132 (H) 02/25/2023   TRIG 86 02/25/2023  CHOLHDL 4.6 02/25/2023    Lab Results  Component Value Date   HGBA1C 11.5 (H) 06/16/2022   Lab Results  Component Value Date   TSH 1.557 06/17/2022       Assessment & Plan    1.  Coronary artery disease: In the setting of cardiomyopathy, recent CT angiogram was performed and showed significant LAD stenosis.  This was followed by diagnostic catheterization which showed 3 tandem lesions in the mid LAD, which were nonobstructive in nature, worst of which being 65% stenosed.  He has been medically managed and was placed on isosorbide 30 mg daily, he has been taking this and has not been having any exertional chest pain though has had a few occasions of chest pain at rest, which is fleeting in nature, lasting less than 1 minute.  He continues to have dyspnea on exertion.  I am increasing isosorbide mononitrate to 60 mg daily.  Continue aspirin, Plavix, beta-blocker, and statin.  Follow-up in 4 weeks.  If he continues to have life limiting symptoms, we will readdress with interventional team potential options for PCI.  2.  Nonischemic cardiomyopathy/chronic heart failure with midrange EF: EF 40 to 45% by echo in May 2024 with global hypokinesis.  He is euvolemic on examination and remains on beta-blocker,  Entresto, and SGLT2 inhibitor.  As above, moderate nonobstructive LAD disease noted on catheterization, which is currently being medically managed.  3.  Hyperlipidemia: LDL was 132 in January at which time he was supposed be on atorvastatin therapy however, today, he indicates that he had run out of atorvastatin and was off of it for several months when labs were checked at that time.  He has since been on rosuvastatin.  I will arrange for follow-up lipids and LFTs and he will come back when he is fasting someday next week.  4.  History of stroke: Chronic right upper extremity hemiplegia.  Prior event monitoring did not show any A-fib/flutter.  He remains on aspirin, statin, and Plavix therapy.  5.  Prior alcohol and tobacco abuse: No alcohol or tobacco since stroke in 2024.  6.  Type 2 diabetes mellitus: A1c was 11.5 in May 2024 and he is followed by his primary care provider.  He reports compliance with Lantus and Jardiance.  7.  Disposition: Follow-up fasting lipids and LFTs in the next week.  Follow-up in clinic in 4 weeks or sooner if necessary.  Nicolasa Ducking, NP 05/13/2023, 4:12 PM

## 2023-05-13 NOTE — Progress Notes (Deleted)
 Office Visit    Patient Name: Wesley Barry Date of Encounter: 05/13/2023  Primary Care Provider:  Emogene Morgan, MD Primary Cardiologist:  Julien Nordmann, MD  Chief Complaint    68 y.o. male   Past Medical History  Subjective   Past Medical History:  Diagnosis Date   Alcohol abuse    CAD (coronary artery disease)    a. 04/2023 Cor CTA: LAD 50-60m (FFRct 0.78), LCX 25-85m (FFRct 0.97), RCA 25-28m (FFRct 0.93). Cor Ca2+ = 2282 (95th%'ile).   Cardiomyopathy (HCC)    a. 06/2022 Echo: EF 40-45%.  Global HK.  Severe LVH.  Grade 1 DD.  Nl RV fxn. Mild AI.   Heart failure with mid-range ejection fraction [facility])    History of shingles 2021   Morbid obesity (HCC)    Stroke (HCC)    a. 06/2022 MRI: Acute infarct of the left basal ganglia; b. 06/2022 Zio: Predominantly sinus rhythm, average 92 (65-164).  1, 7 beat run of nonsustained SVT.  Rare PACs/PVCs.  No A-fib or sustained arrhythmias.   Tobacco abuse    Type 2 diabetes mellitus [facility])    Past Surgical History:  Procedure Laterality Date   HERNIA REPAIR     LEFT HEART CATH AND CORONARY ANGIOGRAPHY Left 04/29/2023   Procedure: LEFT HEART CATH AND CORONARY ANGIOGRAPHY;  Surgeon: Marykay Lex, MD;  Location: [facility] INVASIVE CV LAB;  Service: Cardiovascular;  Laterality: Left;    Allergies  Allergies  Allergen Reactions   Tylenol With Codeine #3 [Acetaminophen-Codeine] Shortness Of Breath    Codeine       History of Present Illness      68 y.o. y/o male 68 y.o. y/o male with a history of cardiomyopathy/HFmrEF, stroke (May 2024), 50-pack-year history tobacco abuse, obesity, shingles, diabetes, and alcohol use.  Wesley Barry with previous evaluated May 2023 in the setting of dyspnea, chest pain, and palpitations.  A cardiac CTA was ordered however, patient did not follow through.  In May 2024, patient presented to [facility] regional with right-sided weakness and a code stroke was called.  MRI of the brain showed acute infarct  of the left basal ganglia.  He was not a candidate for tPA.  An echo was performed and showed an EF of 40 to 45% with global hypokinesis, severe LVH, grade 1 diastolic dysfunction, normal RV function, and mild AI.  He was seen by our team and placed on GDMT with recommendation for outpatient workup of cardiomyopathy, following recovery from stroke.  Subsequent outpatient monitoring showed predominantly sinus rhythm with 1 brief run of SVT, and no episodes of A-fib/flutter.        Objective  Home Medications    Current Outpatient Medications  Medication Sig Dispense Refill   aspirin EC 81 MG tablet Take 1 tablet (81 mg total) by mouth daily. Swallow whole. 30 tablet 3   clopidogrel (PLAVIX) 75 MG tablet Take 1 tablet (75 mg total) by mouth daily. 30 tablet 6   empagliflozin (JARDIANCE) 10 MG TABS tablet Take 1 tablet (10 mg total) by mouth daily. (Patient not taking: Reported on 04/26/2023) 30 tablet 3   folic acid (FOLVITE) 1 MG tablet Take 1 tablet (1 mg total) by mouth daily. 30 tablet 0   insulin glargine (LANTUS) 100 UNIT/ML injection Inject 0.15 mLs (15 Units total) into the skin at bedtime. (Patient not taking: Reported on 04/26/2023) 10 mL 3   isosorbide mononitrate (IMDUR) 30 MG 24 hr tablet Take 1 tablet (30  mg total) by mouth daily. 30 tablet 11   metoprolol succinate (TOPROL-XL) 50 MG 24 hr tablet Take 1 tablet (50 mg total) by mouth daily. Take with or immediately following a meal. 30 tablet 3   nitroGLYCERIN (NITROSTAT) 0.4 MG SL tablet Place 1 tablet (0.4 mg total) under the tongue every 5 (five) minutes as needed for chest pain. 25 tablet 1   rosuvastatin (CRESTOR) 40 MG tablet Take 40 mg by mouth daily.     sacubitril-valsartan (ENTRESTO) 24-26 MG Take 1 tablet by mouth 2 (two) times daily. (Patient not taking: Reported on 04/26/2023) 180 tablet 3   No current facility-administered medications for this visit.     Physical Exam    VS:  There were no vitals taken for this visit.  , BMI There is no height or weight on file to calculate BMI.       GEN: Well nourished, well developed, in no acute distress. HEENT: normal. Neck: Supple, no JVD, carotid bruits, or masses. Cardiac: RRR, no murmurs, rubs, or gallops. No clubbing, cyanosis, edema.  Radials 2+/PT 2+ and equal bilaterally.  Respiratory:  Respirations regular and unlabored, clear to auscultation bilaterally. GI: Soft, nontender, nondistended, BS + x 4. MS: no deformity or atrophy. Skin: warm and dry, no rash. Neuro:  Strength and sensation are intact. Psych: Normal affect.  Accessory Clinical Findings    ECG personally reviewed by me today -    *** - no acute changes.  Lab Results  Component Value Date   WBC 7.1 04/22/2023   HGB 15.0 04/22/2023   HCT 45.5 04/22/2023   MCV 86 04/22/2023   PLT 277 04/22/2023   Lab Results  Component Value Date   CREATININE 0.85 04/22/2023   BUN 10 04/22/2023   NA 141 04/22/2023   K 4.3 04/22/2023   CL 103 04/22/2023   CO2 23 04/22/2023   Lab Results  Component Value Date   ALT 17 02/25/2023   AST 15 02/25/2023   ALKPHOS 39 (L) 02/25/2023   BILITOT 0.4 02/25/2023   Lab Results  Component Value Date   CHOL 189 02/25/2023   HDL 41 02/25/2023   LDLCALC 132 (H) 02/25/2023   TRIG 86 02/25/2023   CHOLHDL 4.6 02/25/2023    Lab Results  Component Value Date   HGBA1C 11.5 (H) 06/16/2022   Lab Results  Component Value Date   TSH 1.557 06/17/2022       Assessment & Plan    1.  ***  Wesley Ducking, NP 05/13/2023, 12:42 PM

## 2023-05-13 NOTE — Patient Instructions (Signed)
 Medication Instructions:  Your physician recommends the following medication changes.  INCREASE: Isosorbide 60 MG daily   *If you need a refill on your cardiac medications before your next appointment, please call your pharmacy*  Lab Work: Your provider would like for you to return in 1 week  to have the following labs drawn: fasting Lipids , LFT's .   Please go to [facility] [address] ([facility] [address], [place] [address] You do not need an appointment.  They are open from 8 am- 4:30 pm.  Lunch from 1:00 pm- 2:00 pm You DO  need to be fasting.   You may also go to one of the following [facility]:  [address] [place], [place] [address] Phone: [phone] Lab hours: Mon-Fri 8 am- 5 pm    Lunch 12 pm- 1 pm   If you have labs (blood work) drawn today and your tests are completely normal, you will receive your results only by: MyChart Message (if you have MyChart) OR A paper copy in the mail If you have any lab test that is abnormal or we need to change your treatment, we will call you to review the results.  Testing/Procedures: No test ordered today   Follow-Up: At [facility], you and your health needs are our priority.  As part of our continuing mission to provide you with exceptional heart care, our providers are all part of one team.  This team includes your primary Cardiologist (physician) and Advanced Practice Providers or APPs (Physician Assistants and Nurse Practitioners) who all work together to provide you with the care you need, when you need it.  Your next appointment:   6 week(s)  Provider:   You may see Julien Nordmann, MD or one of the following Advanced Practice Providers on your designated Care Team:   Nicolasa Ducking, NP Ames Dura, PA-C Eula Listen, PA-C Cadence Flasher, PA-C Charlsie Quest, NP Carlos Levering, NP    We recommend signing up for the patient portal called "MyChart".  Sign up information is  provided on this After Visit Summary.  MyChart is used to connect with patients for Virtual Visits (Telemedicine).  Patients are able to view lab/test results, encounter notes, upcoming appointments, etc.  Non-urgent messages can be sent to your provider as well.   To learn more about what you can do with MyChart, go to [email or web address].

## 2023-05-21 LAB — LIPID PANEL
Chol/HDL Ratio: 3 ratio (ref 0.0–5.0)
Cholesterol, Total: 109 mg/dL (ref 100–199)
HDL: 36 mg/dL — ABNORMAL LOW (ref >39)
LDL Chol Calc (NIH): 57 mg/dL (ref 0–99)
Triglycerides: 82 mg/dL (ref 0–149)
VLDL Cholesterol Cal: 16 mg/dL (ref 5–40)

## 2023-05-21 LAB — HEPATIC FUNCTION PANEL
ALT: 20 IU/L (ref 0–44)
AST: 22 IU/L (ref 0–40)
Albumin: 4.7 g/dL (ref 3.9–4.9)
Alkaline Phosphatase: 37 IU/L — ABNORMAL LOW (ref 44–121)
Bilirubin Total: 0.5 mg/dL (ref 0.0–1.2)
Bilirubin, Direct: 0.13 mg/dL (ref 0.00–0.40)
Total Protein: 7.3 g/dL (ref 6.0–8.5)

## 2023-06-24 ENCOUNTER — Ambulatory Visit: Attending: Nurse Practitioner | Admitting: Nurse Practitioner

## 2023-06-24 ENCOUNTER — Encounter: Payer: Self-pay | Admitting: Nurse Practitioner

## 2023-06-24 VITALS — BP 126/74 | HR 83 | Ht 69.0 in | Wt 196.8 lb

## 2023-06-24 DIAGNOSIS — I25119 Atherosclerotic heart disease of native coronary artery with unspecified angina pectoris: Secondary | ICD-10-CM | POA: Diagnosis not present

## 2023-06-24 DIAGNOSIS — E1165 Type 2 diabetes mellitus with hyperglycemia: Secondary | ICD-10-CM | POA: Diagnosis not present

## 2023-06-24 DIAGNOSIS — E785 Hyperlipidemia, unspecified: Secondary | ICD-10-CM

## 2023-06-24 DIAGNOSIS — I5022 Chronic systolic (congestive) heart failure: Secondary | ICD-10-CM

## 2023-06-24 DIAGNOSIS — I200 Unstable angina: Secondary | ICD-10-CM

## 2023-06-24 DIAGNOSIS — I428 Other cardiomyopathies: Secondary | ICD-10-CM | POA: Diagnosis not present

## 2023-06-24 NOTE — Progress Notes (Signed)
 Office Visit    Patient Name: Wesley Barry Date of Encounter: 06/24/2023  Primary Care Provider:  Lorina Roosevelt, MD Primary Cardiologist:  Wesley Boyden, MD  Chief Complaint    68 y.o. 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  Past Medical History   Subjective   Past Medical History:  Diagnosis Date   Alcohol abuse    CAD (coronary artery disease)    a. 04/2023 Cor CTA: LAD 50-42m (FFRct 0.78), LCX 25-69m (FFRct 0.97), RCA 25-73m (FFRct 0.93). Cor Ca2+ = 2282 (95th%'ile); b. 04/2023 Cath: LM nl, LAD 45/65/55 mid - tandem lesions, D1/2/3 small, LCX nl, OM1 nl, RCA large, nl, RPAV/RPL nl, EF 45-50%.   Cardiomyopathy (HCC)    a. 06/2022 Echo: EF 40-45%.  Global HK.  Severe LVH.  Grade 1 DD.  Nl RV fxn. Mild AI.   Heart failure with mid-range ejection fraction [facility])    History of shingles 2021   Morbid obesity (HCC)    Stroke (HCC)    a. 06/2022 MRI: Acute infarct of the left basal ganglia; b. 06/2022 Zio: Predominantly sinus rhythm, average 92 (65-164).  1, 7 beat run of nonsustained SVT.  Rare PACs/PVCs.  No A-fib or sustained arrhythmias.   Tobacco abuse    Type 2 diabetes mellitus [facility])    Past Surgical History:  Procedure Laterality Date   HERNIA REPAIR     LEFT HEART CATH AND CORONARY ANGIOGRAPHY Left 04/29/2023   Procedure: LEFT HEART CATH AND CORONARY ANGIOGRAPHY;  Surgeon: Arleen Lacer, MD;  Location: [facility] INVASIVE CV LAB;  Service: Cardiovascular;  Laterality: Left;    Allergies  Allergies  Allergen Reactions   Tylenol  With Codeine #3 [Acetaminophen -Codeine] Shortness Of Breath    Codeine        History of Present Illness      68 y.o. y/o male with a history of nonobstructive CAD, nonischemic cardiomyopathy/HFmrEF, stroke  (May 2024), 50-pack-year history tobacco abuse, obesity, shingles, diabetes, and alcohol use.  [name] was previously evaluated in May 2023 in the setting of dyspnea, chest pain, and palpitations.  A cardiac CTA was ordered however, patient did not follow through.  In May 2024, patient presented to [facility] with right-sided weakness and a code stroke was called.  MRI of the brain showed acute infarct of the left basal ganglia.  He was not a candidate for tPA.  An echo was performed and showed an EF of 40 to 45% with global hypokinesis, severe LVH, grade 1 diastolic dysfunction, normal RV function, and mild AI.  He was seen by our team and placed on GDMT with recommendation for outpatient workup of cardiomyopathy, following recovery from stroke.  Subsequent outpatient monitoring showed predominantly sinus rhythm with 1 brief run of SVT, and no episodes of A-fib/flutter.  After missing several appointments, [name] followed-up in January 2025 at which time he was doing well without chest pain or dyspnea, though activity was limited.  In the setting of prior diagnosis of cardiomyopathy, coronary CT angiogram was undertaken and showed significant LAD disease with FFR CT of 0.78.  He was seen back in clinic in March 2025 and set up for diagnostic catheterization, which showed tandem moderate stenoses in the LAD of 45, 65, and 55% with otherwise nonobstructive disease.  Recommendation was made for medical  therapy and he was placed on long-acting nitrate, though PCI would be considered for recalcitrant symptoms.        [name] was last seen in cardiology clinic in April 2025, at which time he noted occasional episodes of fleeting chest pain at rest, which were atypical.  He continued complaint of dyspnea on exertion and Imdur  therapy was increased to 60 mg daily.  Unfortunately, [name] has continued to have dyspnea with relatively limited activity, being able to walk about 40 to 50  feet before having to stop and rest.  He also experiences exertional angina a few times a week, lasting about 5 minutes, resolving with rest.  Usually it is the dyspnea that stops him first though.  He does not experience rest symptoms.  He does note significant lifestyle limitations related to his chest pain and dyspnea.  He has recently noted occasional skipped beats but no sustained tachycardias or irregularity.  He denies PND, orthopnea, dizziness, syncope, edema, or early satiety.  He is interested in pursuing PCI of the LAD if felt to be appropriate. Objective   Home Medications    Current Outpatient Medications  Medication Sig Dispense Refill   aspirin  EC 81 MG tablet Take 1 tablet (81 mg total) by mouth daily. Swallow whole. 30 tablet 3   clopidogrel  (PLAVIX ) 75 MG tablet Take 1 tablet (75 mg total) by mouth daily. 30 tablet 6   empagliflozin  (JARDIANCE ) 10 MG TABS tablet Take 1 tablet (10 mg total) by mouth daily. 30 tablet 3   folic acid  (FOLVITE ) 1 MG tablet Take 1 tablet (1 mg total) by mouth daily. 30 tablet 0   insulin  glargine (LANTUS ) 100 UNIT/ML injection Inject 0.15 mLs (15 Units total) into the skin at bedtime. 10 mL 3   isosorbide  mononitrate (IMDUR ) 60 MG 24 hr tablet Take 1 tablet (60 mg total) by mouth daily. 90 tablet 3   metoprolol  succinate (TOPROL -XL) 50 MG 24 hr tablet Take 1 tablet (50 mg total) by mouth daily. Take with or immediately following a meal. 30 tablet 3   nitroGLYCERIN  (NITROSTAT ) 0.4 MG SL tablet Place 1 tablet (0.4 mg total) under the tongue every 5 (five) minutes as needed for chest pain. 25 tablet 1   rosuvastatin (CRESTOR) 40 MG tablet Take 40 mg by mouth daily.     sacubitril -valsartan  (ENTRESTO ) 24-26 MG Take 1 tablet by mouth 2 (two) times daily. 180 tablet 3   No current facility-administered medications for this visit.    Family history    Family History  Problem Relation Age of Onset   Heart attack Mother 58   Heart attack Father 62     Social history    Social History   Socioeconomic History   Marital status: Married    Spouse name: Not on file   Number of children: Not on file   Years of education: Not on file   Highest education level: Not on file  Occupational History   Not on file  Tobacco Use   Smoking status: Former    Current packs/day: 0.00    Average packs/day: 2.0 packs/day for 25.0 years (50.0 ttl pk-yrs)    Types: Cigarettes    Start date: 54    Quit date: 2004    Years since quitting: 21.4   Smokeless tobacco: Not on file  Substance and Sexual Activity   Alcohol use: Not Currently    Alcohol/week: 18.0 standard drinks of alcohol    Types: 18 Cans of beer  per week    Comment: not drinking sine may 2024   Drug use: Never   Sexual activity: Not on file  Other Topics Concern   Not on file  Social History Narrative   Not on file   Social Drivers of Health   Financial Resource Strain: Not on file  Food Insecurity: No Food Insecurity (06/17/2022)   Hunger Vital Sign    Worried About Running Out of Food in the Last Year: Never true    Ran Out of Food in the Last Year: Never true  Transportation Needs: No Transportation Needs (06/17/2022)   PRAPARE - Administrator, Civil Service (Medical): No    Lack of Transportation (Non-Medical): No  Physical Activity: Not on file  Stress: Not on file  Social Connections: Not on file  Intimate Partner Violence: Not At Risk (06/17/2022)   Humiliation, Afraid, Rape, and Kick questionnaire    Fear of Current or Ex-Partner: No    Emotionally Abused: No    Physically Abused: No    Sexually Abused: No    Review of systems    +++ Exertional chest pain and dyspnea as outlined above.  Rare skipped beat.  No n, v, dizziness, syncope, edema, early satiety, dysuria, dark stools, blood in stools, diarrhea, rash/skin changes, fevers, chills, wt loss/gain.  Otherwise all systems reviewed and negative.  Physical Exam    VS:  BP 126/74   Pulse 83   Ht  5\' 9"  (1.753 m)   Wt 196 lb 12.8 oz (89.3 kg)   SpO2 96%   BMI 29.06 kg/m  , BMI Body mass index is 29.06 kg/m.       GEN: Well nourished, well developed, in no acute distress. HEENT: normal. Neck: Supple, no JVD, carotid bruits, or masses. Cardiac: RRR, no murmurs, rubs, or gallops. No clubbing, cyanosis, edema.  Radials 2+/PT 2+ and equal bilaterally.  Respiratory:  Respirations regular and unlabored, clear to auscultation bilaterally. GI: Soft, nontender, nondistended, BS + x 4. MS: no deformity or atrophy. Skin: warm and dry, no rash. Neuro:  Strength and sensation are intact.  Right arm weakness. Psych: Normal affect.  Accessory Clinical Findings    ECG personally reviewed by me today - EKG Interpretation Date/Time:  Thursday Jun 24 2023 08:24:25 EDT Ventricular Rate:  75 PR Interval:  148 QRS Duration:  108 QT Interval:  416 QTC Calculation: 464 R Axis:   -25  Text Interpretation: Normal sinus rhythm Septal infarct , age undetermined Confirmed by Laneta Pintos [phone]) on 06/24/2023 8:25:57 AM  - no acute changes.  Lab Results  Component Value Date   WBC 7.1 04/22/2023   HGB 15.0 04/22/2023   HCT 45.5 04/22/2023   MCV 86 04/22/2023   PLT 277 04/22/2023   Lab Results  Component Value Date   CREATININE 0.85 04/22/2023   BUN 10 04/22/2023   NA 141 04/22/2023   K 4.3 04/22/2023   CL 103 04/22/2023   CO2 23 04/22/2023   Lab Results  Component Value Date   ALT 20 05/20/2023   AST 22 05/20/2023   ALKPHOS 37 (L) 05/20/2023   BILITOT 0.5 05/20/2023   Lab Results  Component Value Date   CHOL 109 05/20/2023   HDL 36 (L) 05/20/2023   LDLCALC 57 05/20/2023   TRIG 82 05/20/2023   CHOLHDL 3.0 05/20/2023    Lab Results  Component Value Date   HGBA1C 11.5 (H) 06/16/2022   Lab Results  Component Value Date  TSH 1.557 06/17/2022       Assessment & Plan    1.  Coronary artery disease/unstable angina: Prior CT angiogram performed in the setting of  known cardiomyopathy with significant LAD disease.  Diagnostic catheterization in March 2025 with 3 tandem lesions in the LAD with initial recommendation for medical therapy with understanding that PCI may be appropriate for ongoing symptoms.  Unfortunately, despite initiation and subsequent titration of nitrate therapy, patient continues to experience exertional dyspnea and angina after walking about 40 to 50 feet.  This has become significantly lifestyle limiting.  I reviewed his case and cath films with Dr. Addie Holstein today, and we have collectively decided to pursue PCI of the LAD.  Discussed with patient who is in agreement.  Continue aspirin , Plavix , beta-blocker, statin, Entresto , and nitrate.  Obtain CBC and basic metabolic panel today.  Plan for cath at [facility] in approximately 10 days. Informed Consent   Shared Decision Making/Informed Consent The risks [stroke (1 in 1000), death (1 in 1000), kidney failure [usually temporary] (1 in 500), bleeding (1 in 200), allergic reaction [possibly serious] (1 in 200)], benefits (diagnostic support and management of coronary artery disease) and alternatives of a cardiac catheterization were discussed in detail with Mr. Escoto and he is willing to proceed.     2.  Nonischemic cardiomyopathy/chronic heart failure midrange ejection fraction: EF 40 to 45% by echo May 2024 with global hypokinesis.  He is euvolemic on examination remains on beta-blocker, Entresto , and SGLT2 inhibitor.  Plan for PCI as outlined above.  3.  Hyperlipidemia: LDL of 132 in January at which time he was off of his atorvastatin .  He is now on rosuvastatin and had follow-up lipids in April which showed an LDL of 57 with normal LFTs.  4.  History of stroke: Chronic right upper extremity hemiplegia.  Prior event monitor did not show any atrial fibrillation/flutter.  Remains on aspirin , statin, and Plavix .  5.  Prior alcohol and tobacco abuse: No alcohol or tobacco since stroke in 2024.  6.   Type 2 diabetes mellitus: A1c 11.5 in May 2024.  He is followed by his primary care provider and reports compliance with Lantus  and Jardiance .  7.  Disposition: Following up CBC and basic metabolic panel today with plan for diagnostic catheterization and PCI with Dr. Addie Holstein in [place] in approximately 10 days.  Follow-up in clinic 2 weeks post catheterization.  Laneta Pintos, NP 06/24/2023, 8:36 AM

## 2023-06-24 NOTE — Patient Instructions (Addendum)
 Medication Instructions:  Your physician recommends that you continue on your current medications as directed. Please refer to the Current Medication list given to you today.   *If you need a refill on your cardiac medications before your next appointment, please call your pharmacy*  Lab Work: Your provider would like for you to have following labs drawn today (CBC, BMP).    Testing/Procedures: Your physician has requested that you have a cardiac catheterization. Cardiac catheterization is used to diagnose and/or treat various heart conditions. Doctors may recommend this procedure for a number of different reasons. The most common reason is to evaluate chest pain. Chest pain can be a symptom of coronary artery disease (CAD), and cardiac catheterization can show whether plaque is narrowing or blocking your heart's arteries. This procedure is also used to evaluate the valves, as well as measure the blood flow and oxygen levels in different parts of your heart. For further information please visit [email or web address] Please follow instruction sheet, as given. Please see instructions below  Follow-Up: At [facility], you and your health needs are our priority.  As part of our continuing mission to provide you with exceptional heart care, our providers are all part of one team.  This team includes your primary Cardiologist (physician) and Advanced Practice Providers or APPs (Physician Assistants and Nurse Practitioners) who all work together to provide you with the care you need, when you need it.  Your next appointment:   3 week(s)  Provider:    Laneta Pintos, NP   [facility] A DEPT OF [place] AT [facility] [address], [address] [place] [address] Dept: [phone] Loc: [phone]  Wesley Barry  06/24/2023  You are scheduled for a Cardiac Catheterization on Monday, June 2 with Dr. Randene Bustard.  1. Please arrive at the [facility] (Main Entrance A) at [facility]: [address] [place], [place] [address] at 8:30 AM (This time is 2 hour(s) before your procedure to ensure your preparation).   Free valet parking service is available. You will check in at ADMITTING. The support person will be asked to wait in the waiting room.  It is OK to have someone drop you off and come back when you are ready to be discharged.    Special note: Every effort is made to have your procedure done on time. Please understand that emergencies sometimes delay scheduled procedures.  2. Diet: Do not eat solid foods after midnight.  The patient may have clear liquids until 5am upon the day of the procedure.  3. Labs: You will need to have blood drawn today (CBC, BMP)  4. Medication instructions in preparation for your procedure:   Contrast Allergy: No  Take 1/2 lantus  dose the night before procedure  Hold Jardiance  the morning of procedure   On the morning of your procedure, take your Aspirin  81 mg and Plavix /Clopidogrel  and any morning medicines NOT listed above.  You may use sips of water.  5. Plan to go home the same day, you will only stay overnight if medically necessary. 6. Bring a current list of your medications and current insurance cards. 7. You MUST have a responsible person to drive you home. 8. Someone MUST be with you the first 24 hours after you arrive home or your discharge will be delayed. 9. Please wear clothes that are easy to get on and off and wear slip-on shoes.  Thank you for allowing us   to care for you!   -- [place] [place] [facility]

## 2023-06-24 NOTE — H&P (View-Only) (Signed)
 Office Visit    Patient Name: Wesley Barry Date of Encounter: 06/24/2023  Primary Care Provider:  Lorina Roosevelt, MD Primary Cardiologist:  Belva Boyden, MD  Chief Complaint    68 y.o. 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  Past Medical History   Subjective   Past Medical History:  Diagnosis Date   Alcohol abuse    CAD (coronary artery disease)    a. 04/2023 Cor CTA: LAD 50-42m (FFRct 0.78), LCX 25-69m (FFRct 0.97), RCA 25-73m (FFRct 0.93). Cor Ca2+ = 2282 (95th%'ile); b. 04/2023 Cath: LM nl, LAD 45/65/55 mid - tandem lesions, D1/2/3 small, LCX nl, OM1 nl, RCA large, nl, RPAV/RPL nl, EF 45-50%.   Cardiomyopathy (HCC)    a. 06/2022 Echo: EF 40-45%.  Global HK.  Severe LVH.  Grade 1 DD.  Nl RV fxn. Mild AI.   Heart failure with mid-range ejection fraction [facility])    History of shingles 2021   Morbid obesity (HCC)    Stroke (HCC)    a. 06/2022 MRI: Acute infarct of the left basal ganglia; b. 06/2022 Zio: Predominantly sinus rhythm, average 92 (65-164).  1, 7 beat run of nonsustained SVT.  Rare PACs/PVCs.  No A-fib or sustained arrhythmias.   Tobacco abuse    Type 2 diabetes mellitus [facility])    Past Surgical History:  Procedure Laterality Date   HERNIA REPAIR     LEFT HEART CATH AND CORONARY ANGIOGRAPHY Left 04/29/2023   Procedure: LEFT HEART CATH AND CORONARY ANGIOGRAPHY;  Surgeon: Arleen Lacer, MD;  Location: [facility] INVASIVE CV LAB;  Service: Cardiovascular;  Laterality: Left;    Allergies  Allergies  Allergen Reactions   Tylenol  With Codeine #3 [Acetaminophen -Codeine] Shortness Of Breath    Codeine        History of Present Illness      68 y.o. y/o male with a history of nonobstructive CAD, nonischemic cardiomyopathy/HFmrEF, stroke  (May 2024), 50-pack-year history tobacco abuse, obesity, shingles, diabetes, and alcohol use.  Wesley Barry was previously evaluated in May 2023 in the setting of dyspnea, chest pain, and palpitations.  A cardiac CTA was ordered however, patient did not follow through.  In May 2024, patient presented to [facility] with right-sided weakness and a code stroke was called.  MRI of the brain showed acute infarct of the left basal ganglia.  He was not a candidate for tPA.  An echo was performed and showed an EF of 40 to 45% with global hypokinesis, severe LVH, grade 1 diastolic dysfunction, normal RV function, and mild AI.  He was seen by our team and placed on GDMT with recommendation for outpatient workup of cardiomyopathy, following recovery from stroke.  Subsequent outpatient monitoring showed predominantly sinus rhythm with 1 brief run of SVT, and no episodes of A-fib/flutter.  After missing several appointments, Wesley Barry followed-up in January 2025 at which time he was doing well without chest pain or dyspnea, though activity was limited.  In the setting of prior diagnosis of cardiomyopathy, coronary CT angiogram was undertaken and showed significant LAD disease with FFR CT of 0.78.  He was seen back in clinic in March 2025 and set up for diagnostic catheterization, which showed tandem moderate stenoses in the LAD of 45, 65, and 55% with otherwise nonobstructive disease.  Recommendation was made for medical  therapy and he was placed on long-acting nitrate, though PCI would be considered for recalcitrant symptoms.        Wesley Barry was last seen in cardiology clinic in April 2025, at which time he noted occasional episodes of fleeting chest pain at rest, which were atypical.  He continued complaint of dyspnea on exertion and Imdur  therapy was increased to 60 mg daily.  Unfortunately, Wesley Barry has continued to have dyspnea with relatively limited activity, being able to walk about 40 to 50  feet before having to stop and rest.  He also experiences exertional angina a few times a week, lasting about 5 minutes, resolving with rest.  Usually it is the dyspnea that stops him first though.  He does not experience rest symptoms.  He does note significant lifestyle limitations related to his chest pain and dyspnea.  He has recently noted occasional skipped beats but no sustained tachycardias or irregularity.  He denies PND, orthopnea, dizziness, syncope, edema, or early satiety.  He is interested in pursuing PCI of the LAD if felt to be appropriate. Objective   Home Medications    Current Outpatient Medications  Medication Sig Dispense Refill   aspirin  EC 81 MG tablet Take 1 tablet (81 mg total) by mouth daily. Swallow whole. 30 tablet 3   clopidogrel  (PLAVIX ) 75 MG tablet Take 1 tablet (75 mg total) by mouth daily. 30 tablet 6   empagliflozin  (JARDIANCE ) 10 MG TABS tablet Take 1 tablet (10 mg total) by mouth daily. 30 tablet 3   folic acid  (FOLVITE ) 1 MG tablet Take 1 tablet (1 mg total) by mouth daily. 30 tablet 0   insulin  glargine (LANTUS ) 100 UNIT/ML injection Inject 0.15 mLs (15 Units total) into the skin at bedtime. 10 mL 3   isosorbide  mononitrate (IMDUR ) 60 MG 24 hr tablet Take 1 tablet (60 mg total) by mouth daily. 90 tablet 3   metoprolol  succinate (TOPROL -XL) 50 MG 24 hr tablet Take 1 tablet (50 mg total) by mouth daily. Take with or immediately following a meal. 30 tablet 3   nitroGLYCERIN  (NITROSTAT ) 0.4 MG SL tablet Place 1 tablet (0.4 mg total) under the tongue every 5 (five) minutes as needed for chest pain. 25 tablet 1   rosuvastatin (CRESTOR) 40 MG tablet Take 40 mg by mouth daily.     sacubitril -valsartan  (ENTRESTO ) 24-26 MG Take 1 tablet by mouth 2 (two) times daily. 180 tablet 3   No current facility-administered medications for this visit.    Family history    Family History  Problem Relation Age of Onset   Heart attack Mother 58   Heart attack Father 62     Social history    Social History   Socioeconomic History   Marital status: Married    Spouse name: Not on file   Number of children: Not on file   Years of education: Not on file   Highest education level: Not on file  Occupational History   Not on file  Tobacco Use   Smoking status: Former    Current packs/day: 0.00    Average packs/day: 2.0 packs/day for 25.0 years (50.0 ttl pk-yrs)    Types: Cigarettes    Start date: 54    Quit date: 2004    Years since quitting: 21.4   Smokeless tobacco: Not on file  Substance and Sexual Activity   Alcohol use: Not Currently    Alcohol/week: 18.0 standard drinks of alcohol    Types: 18 Cans of beer  per week    Comment: not drinking sine may 2024   Drug use: Never   Sexual activity: Not on file  Other Topics Concern   Not on file  Social History Narrative   Not on file   Social Drivers of Health   Financial Resource Strain: Not on file  Food Insecurity: No Food Insecurity (06/17/2022)   Hunger Vital Sign    Worried About Running Out of Food in the Last Year: Never true    Ran Out of Food in the Last Year: Never true  Transportation Needs: No Transportation Needs (06/17/2022)   PRAPARE - Administrator, Civil Service (Medical): No    Lack of Transportation (Non-Medical): No  Physical Activity: Not on file  Stress: Not on file  Social Connections: Not on file  Intimate Partner Violence: Not At Risk (06/17/2022)   Humiliation, Afraid, Rape, and Kick questionnaire    Fear of Current or Ex-Partner: No    Emotionally Abused: No    Physically Abused: No    Sexually Abused: No    Review of systems    +++ Exertional chest pain and dyspnea as outlined above.  Rare skipped beat.  No n, v, dizziness, syncope, edema, early satiety, dysuria, dark stools, blood in stools, diarrhea, rash/skin changes, fevers, chills, wt loss/gain.  Otherwise all systems reviewed and negative.  Physical Exam    VS:  BP 126/74   Pulse 83   Ht  5\' 9"  (1.753 m)   Wt 196 lb 12.8 oz (89.3 kg)   SpO2 96%   BMI 29.06 kg/m  , BMI Body mass index is 29.06 kg/m.       GEN: Well nourished, well developed, in no acute distress. HEENT: normal. Neck: Supple, no JVD, carotid bruits, or masses. Cardiac: RRR, no murmurs, rubs, or gallops. No clubbing, cyanosis, edema.  Radials 2+/PT 2+ and equal bilaterally.  Respiratory:  Respirations regular and unlabored, clear to auscultation bilaterally. GI: Soft, nontender, nondistended, BS + x 4. MS: no deformity or atrophy. Skin: warm and dry, no rash. Neuro:  Strength and sensation are intact.  Right arm weakness. Psych: Normal affect.  Accessory Clinical Findings    ECG personally reviewed by me today - EKG Interpretation Date/Time:  Thursday Jun 24 2023 08:24:25 EDT Ventricular Rate:  75 PR Interval:  148 QRS Duration:  108 QT Interval:  416 QTC Calculation: 464 R Axis:   -25  Text Interpretation: Normal sinus rhythm Septal infarct , age undetermined Confirmed by Laneta Pintos [phone]) on 06/24/2023 8:25:57 AM  - no acute changes.  Lab Results  Component Value Date   WBC 7.1 04/22/2023   HGB 15.0 04/22/2023   HCT 45.5 04/22/2023   MCV 86 04/22/2023   PLT 277 04/22/2023   Lab Results  Component Value Date   CREATININE 0.85 04/22/2023   BUN 10 04/22/2023   NA 141 04/22/2023   K 4.3 04/22/2023   CL 103 04/22/2023   CO2 23 04/22/2023   Lab Results  Component Value Date   ALT 20 05/20/2023   AST 22 05/20/2023   ALKPHOS 37 (L) 05/20/2023   BILITOT 0.5 05/20/2023   Lab Results  Component Value Date   CHOL 109 05/20/2023   HDL 36 (L) 05/20/2023   LDLCALC 57 05/20/2023   TRIG 82 05/20/2023   CHOLHDL 3.0 05/20/2023    Lab Results  Component Value Date   HGBA1C 11.5 (H) 06/16/2022   Lab Results  Component Value Date  TSH 1.557 06/17/2022       Assessment & Plan    1.  Coronary artery disease/unstable angina: Prior CT angiogram performed in the setting of  known cardiomyopathy with significant LAD disease.  Diagnostic catheterization in March 2025 with 3 tandem lesions in the LAD with initial recommendation for medical therapy with understanding that PCI may be appropriate for ongoing symptoms.  Unfortunately, despite initiation and subsequent titration of nitrate therapy, patient continues to experience exertional dyspnea and angina after walking about 40 to 50 feet.  This has become significantly lifestyle limiting.  I reviewed his case and cath films with Dr. Addie Holstein today, and we have collectively decided to pursue PCI of the LAD.  Discussed with patient who is in agreement.  Continue aspirin , Plavix , beta-blocker, statin, Entresto , and nitrate.  Obtain CBC and basic metabolic panel today.  Plan for cath at [facility] in approximately 10 days. Informed Consent   Shared Decision Making/Informed Consent The risks [stroke (1 in 1000), death (1 in 1000), kidney failure [usually temporary] (1 in 500), bleeding (1 in 200), allergic reaction [possibly serious] (1 in 200)], benefits (diagnostic support and management of coronary artery disease) and alternatives of a cardiac catheterization were discussed in detail with Mr. Escoto and he is willing to proceed.     2.  Nonischemic cardiomyopathy/chronic heart failure midrange ejection fraction: EF 40 to 45% by echo May 2024 with global hypokinesis.  He is euvolemic on examination remains on beta-blocker, Entresto , and SGLT2 inhibitor.  Plan for PCI as outlined above.  3.  Hyperlipidemia: LDL of 132 in January at which time he was off of his atorvastatin .  He is now on rosuvastatin and had follow-up lipids in April which showed an LDL of 57 with normal LFTs.  4.  History of stroke: Chronic right upper extremity hemiplegia.  Prior event monitor did not show any atrial fibrillation/flutter.  Remains on aspirin , statin, and Plavix .  5.  Prior alcohol and tobacco abuse: No alcohol or tobacco since stroke in 2024.  6.   Type 2 diabetes mellitus: A1c 11.5 in May 2024.  He is followed by his primary care provider and reports compliance with Lantus  and Jardiance .  7.  Disposition: Following up CBC and basic metabolic panel today with plan for diagnostic catheterization and PCI with Dr. Addie Holstein in [place] in approximately 10 days.  Follow-up in clinic 2 weeks post catheterization.  Laneta Pintos, NP 06/24/2023, 8:36 AM

## 2023-06-25 ENCOUNTER — Ambulatory Visit: Payer: Self-pay | Admitting: Nurse Practitioner

## 2023-06-25 LAB — BASIC METABOLIC PANEL WITH GFR
BUN/Creatinine Ratio: 12 (ref 10–24)
BUN: 15 mg/dL (ref 8–27)
CO2: 18 mmol/L — ABNORMAL LOW (ref 20–29)
Calcium: 9 mg/dL (ref 8.6–10.2)
Chloride: 108 mmol/L — ABNORMAL HIGH (ref 96–106)
Creatinine, Ser: 1.24 mg/dL (ref 0.76–1.27)
Glucose: 129 mg/dL — ABNORMAL HIGH (ref 70–99)
Potassium: 4 mmol/L (ref 3.5–5.2)
Sodium: 144 mmol/L (ref 134–144)
eGFR: 63 mL/min/{1.73_m2} (ref >59)

## 2023-06-25 LAB — CBC
Hematocrit: 38.1 % (ref 37.5–51.0)
Hemoglobin: 12.1 g/dL — ABNORMAL LOW (ref 13.0–17.7)
MCH: 28.4 pg (ref 26.6–33.0)
MCHC: 31.8 g/dL (ref 31.5–35.7)
MCV: 89 fL (ref 79–97)
Platelets: 279 10*3/uL (ref 150–450)
RBC: 4.26 x10E6/uL (ref 4.14–5.80)
RDW: 13.2 % (ref 11.6–15.4)
WBC: 5.3 10*3/uL (ref 3.4–10.8)

## 2023-07-01 ENCOUNTER — Telehealth: Payer: Self-pay | Admitting: *Deleted

## 2023-07-01 NOTE — Telephone Encounter (Addendum)
 Coronary Stent scheduled at [facility] for: Monday July 05, 2023 10:30 AM Arrival time [facility] Main Entrance A at: 8:30 AM  Nothing to eat after midnight prior to procedure, clear liquids until 5 AM day of procedure.  Medication instructions:  -Usual morning medications can be taken with sips of water including aspirin  81 mg and Plavix  75 mg.  Plan to go home the same day, you will only stay overnight if medically necessary.  You must have responsible adult to drive you home.  Someone must be with you the first 24 hours after you arrive home.  Reviewed procedure instructions with Ellin Gutter (DPR).

## 2023-07-05 ENCOUNTER — Encounter (HOSPITAL_COMMUNITY)
Admission: RE | Disposition: A | Discharge status: Home / Self Care | Payer: Self-pay | Source: Home / Self Care | Attending: Cardiology

## 2023-07-05 ENCOUNTER — Telehealth (HOSPITAL_COMMUNITY): Payer: Self-pay | Admitting: Pharmacy Technician

## 2023-07-05 ENCOUNTER — Other Ambulatory Visit (HOSPITAL_COMMUNITY): Payer: Self-pay

## 2023-07-05 ENCOUNTER — Other Ambulatory Visit: Payer: Self-pay

## 2023-07-05 ENCOUNTER — Ambulatory Visit (HOSPITAL_COMMUNITY)
Admission: RE | Admit: 2023-07-05 | Discharge: 2023-07-05 | Disposition: A | Discharge status: Home / Self Care | Attending: Cardiology | Admitting: Cardiology

## 2023-07-05 ENCOUNTER — Encounter (HOSPITAL_COMMUNITY): Payer: Self-pay | Admitting: Cardiology

## 2023-07-05 DIAGNOSIS — Z7982 Long term (current) use of aspirin: Secondary | ICD-10-CM | POA: Diagnosis not present

## 2023-07-05 DIAGNOSIS — I25119 Atherosclerotic heart disease of native coronary artery with unspecified angina pectoris: Secondary | ICD-10-CM

## 2023-07-05 DIAGNOSIS — E119 Type 2 diabetes mellitus without complications: Secondary | ICD-10-CM | POA: Insufficient documentation

## 2023-07-05 DIAGNOSIS — Z7902 Long term (current) use of antithrombotics/antiplatelets: Secondary | ICD-10-CM | POA: Insufficient documentation

## 2023-07-05 DIAGNOSIS — I69351 Hemiplegia and hemiparesis following cerebral infarction affecting right dominant side: Secondary | ICD-10-CM | POA: Insufficient documentation

## 2023-07-05 DIAGNOSIS — I25110 Atherosclerotic heart disease of native coronary artery with unstable angina pectoris: Secondary | ICD-10-CM | POA: Diagnosis present

## 2023-07-05 DIAGNOSIS — I428 Other cardiomyopathies: Secondary | ICD-10-CM | POA: Diagnosis not present

## 2023-07-05 DIAGNOSIS — E785 Hyperlipidemia, unspecified: Secondary | ICD-10-CM | POA: Diagnosis not present

## 2023-07-05 DIAGNOSIS — Z79899 Other long term (current) drug therapy: Secondary | ICD-10-CM | POA: Diagnosis not present

## 2023-07-05 DIAGNOSIS — Z955 Presence of coronary angioplasty implant and graft: Secondary | ICD-10-CM

## 2023-07-05 DIAGNOSIS — E1169 Type 2 diabetes mellitus with other specified complication: Secondary | ICD-10-CM | POA: Diagnosis present

## 2023-07-05 DIAGNOSIS — Z87891 Personal history of nicotine dependence: Secondary | ICD-10-CM | POA: Insufficient documentation

## 2023-07-05 DIAGNOSIS — Z794 Long term (current) use of insulin: Secondary | ICD-10-CM | POA: Insufficient documentation

## 2023-07-05 DIAGNOSIS — Z7984 Long term (current) use of oral hypoglycemic drugs: Secondary | ICD-10-CM | POA: Diagnosis not present

## 2023-07-05 DIAGNOSIS — I209 Angina pectoris, unspecified: Secondary | ICD-10-CM | POA: Diagnosis present

## 2023-07-05 DIAGNOSIS — I5022 Chronic systolic (congestive) heart failure: Secondary | ICD-10-CM | POA: Insufficient documentation

## 2023-07-05 DIAGNOSIS — I10 Essential (primary) hypertension: Secondary | ICD-10-CM | POA: Diagnosis present

## 2023-07-05 DIAGNOSIS — I25111 Atherosclerotic heart disease of native coronary artery with angina pectoris with documented spasm: Secondary | ICD-10-CM | POA: Diagnosis present

## 2023-07-05 HISTORY — PX: CORONARY IMAGING/OCT: CATH118326

## 2023-07-05 HISTORY — PX: LEFT HEART CATH: CATH118248

## 2023-07-05 HISTORY — PX: CORONARY STENT INTERVENTION: CATH118234

## 2023-07-05 LAB — GLUCOSE, CAPILLARY: Glucose-Capillary: 129 mg/dL — ABNORMAL HIGH (ref 70–99)

## 2023-07-05 LAB — POCT ACTIVATED CLOTTING TIME
Activated Clotting Time: 250 s
Activated Clotting Time: 273 s
Activated Clotting Time: 308 s

## 2023-07-05 SURGERY — CORONARY STENT INTERVENTION
Anesthesia: LOCAL

## 2023-07-05 MED ORDER — HEPARIN SODIUM (PORCINE) 1000 UNIT/ML IJ SOLN
INTRAMUSCULAR | Status: DC | PRN
Start: 2023-07-05 — End: 2023-07-05
  Administered 2023-07-05: 2000 [IU] via INTRAVENOUS
  Administered 2023-07-05: 8000 [IU] via INTRAVENOUS
  Administered 2023-07-05: 2000 [IU] via INTRAVENOUS

## 2023-07-05 MED ORDER — SODIUM CHLORIDE 0.9% FLUSH: 3.0000 mL | Freq: Two times a day (BID) | INTRAVENOUS | Status: DC

## 2023-07-05 MED ORDER — HEPARIN SODIUM (PORCINE) 1000 UNIT/ML IJ SOLN
INTRAMUSCULAR | Status: AC
  Filled 2023-07-05: qty 10

## 2023-07-05 MED ORDER — FENTANYL CITRATE (PF) 100 MCG/2ML IJ SOLN
INTRAMUSCULAR | Status: AC
  Filled 2023-07-05: qty 2

## 2023-07-05 MED ORDER — ASPIRIN 81 MG PO CHEW: 81.0000 mg | CHEWABLE_TABLET | ORAL | Status: DC

## 2023-07-05 MED ORDER — SODIUM CHLORIDE 0.9% FLUSH: 3.0000 mL | INTRAVENOUS | Status: DC | PRN

## 2023-07-05 MED ORDER — SODIUM CHLORIDE 0.9 % IV SOLN: INTRAVENOUS | Status: DC

## 2023-07-05 MED ORDER — IOHEXOL 350 MG/ML SOLN
INTRAVENOUS | Status: DC | PRN
  Administered 2023-07-05: 125 mL

## 2023-07-05 MED ORDER — LIDOCAINE HCL (PF) 1 % IJ SOLN
INTRAMUSCULAR | Status: DC | PRN
  Administered 2023-07-05: 5 mL

## 2023-07-05 MED ORDER — VERAPAMIL HCL 2.5 MG/ML IV SOLN
INTRAVENOUS | Status: AC
  Filled 2023-07-05: qty 2

## 2023-07-05 MED ORDER — MIDAZOLAM HCL 2 MG/2ML IJ SOLN
INTRAMUSCULAR | Status: DC | PRN
  Administered 2023-07-05: 1 mg via INTRAVENOUS

## 2023-07-05 MED ORDER — ONDANSETRON HCL 4 MG/2ML IJ SOLN: 4.0000 mg | Freq: Four times a day (QID) | INTRAMUSCULAR | Status: DC | PRN

## 2023-07-05 MED ORDER — LIDOCAINE HCL (PF) 1 % IJ SOLN
INTRAMUSCULAR | Status: AC
  Filled 2023-07-05: qty 30

## 2023-07-05 MED ORDER — FENTANYL CITRATE (PF) 100 MCG/2ML IJ SOLN
INTRAMUSCULAR | Status: DC | PRN
  Administered 2023-07-05: 25 ug via INTRAVENOUS

## 2023-07-05 MED ORDER — LABETALOL HCL 5 MG/ML IV SOLN: 10.0000 mg | INTRAVENOUS | Status: DC | PRN

## 2023-07-05 MED ORDER — CLOPIDOGREL BISULFATE 75 MG PO TABS
75.0000 mg | ORAL_TABLET | Freq: Once | ORAL | Status: AC
  Administered 2023-07-05: 75 mg via ORAL
  Filled 2023-07-05: qty 1

## 2023-07-05 MED ORDER — CLOPIDOGREL BISULFATE 75 MG PO TABS: 75.0000 mg | ORAL_TABLET | Freq: Every day | ORAL | Status: DC

## 2023-07-05 MED ORDER — HYDRALAZINE HCL 20 MG/ML IJ SOLN: 10.0000 mg | INTRAMUSCULAR | Status: DC | PRN

## 2023-07-05 MED ORDER — HEPARIN (PORCINE) IN NACL 2000-0.9 UNIT/L-% IV SOLN
INTRAVENOUS | Status: DC | PRN
  Administered 2023-07-05: 1000 mL

## 2023-07-05 MED ORDER — SODIUM CHLORIDE 0.9 % IV SOLN
250.0000 mL | INTRAVENOUS | Status: DC | PRN
Start: 2023-07-05 — End: 2023-07-05

## 2023-07-05 MED ORDER — VERAPAMIL HCL 2.5 MG/ML IV SOLN
INTRAVENOUS | Status: DC | PRN
  Administered 2023-07-05: .8 mL via INTRA_ARTERIAL
  Administered 2023-07-05: 10 mL via INTRA_ARTERIAL

## 2023-07-05 MED ORDER — ASPIRIN 81 MG PO CHEW
81.0000 mg | CHEWABLE_TABLET | ORAL | Status: AC
  Administered 2023-07-05: 81 mg via ORAL
  Filled 2023-07-05: qty 1

## 2023-07-05 MED ORDER — MIDAZOLAM HCL 2 MG/2ML IJ SOLN
INTRAMUSCULAR | Status: AC
  Filled 2023-07-05: qty 2

## 2023-07-05 SURGICAL SUPPLY — 16 items
BALLOON EMERGE MR 2.25X20 (BALLOONS) IMPLANT
BALLOON ~~LOC~~ EMERGE MR 2.5X20 (BALLOONS) IMPLANT
CATH DRAGONFLY OPSTAR (CATHETERS) IMPLANT
CATH INFINITI JR4 5F (CATHETERS) IMPLANT
CATH VISTA GUIDE 6FR XB4 MULPK (CATHETERS) IMPLANT
CATH VISTA GUIDE 6FR XBLD 3.5 (CATHETERS) IMPLANT
DEVICE RAD COMP TR BAND LRG (VASCULAR PRODUCTS) IMPLANT
GLIDESHEATH SLEND SS 6F .021 (SHEATH) IMPLANT
GUIDEWIRE INQWIRE 1.5J.035X260 (WIRE) IMPLANT
KIT ENCORE 26 ADVANTAGE (KITS) IMPLANT
KIT SINGLE USE MANIFOLD (KITS) IMPLANT
PACK CARDIAC CATHETERIZATION (CUSTOM PROCEDURE TRAY) ×1 IMPLANT
SET ATX-X65L (MISCELLANEOUS) IMPLANT
SHEATH PROBE COVER 6X72 (BAG) IMPLANT
STENT ONYX FRONTIER 2.25X38 (Permanent Stent) IMPLANT
WIRE ASAHI PROWATER 180CM (WIRE) IMPLANT

## 2023-07-05 NOTE — Discharge Instructions (Signed)
 Radial Site Care The following information offers guidance on how to care for yourself after your procedure. Your health care provider may also give you more specific instructions. If you have problems or questions, contact your health care provider. What can I expect after the procedure? After the procedure, it is common to have bruising and tenderness in the incision area. Follow these instructions at home: Incision site care  Follow instructions from your health care provider about how to take care of your incision site. Make sure you: Wash your hands with soap and water for at least 20 seconds before and after you change your bandage (dressing). If soap and water are not available, use hand sanitizer. Remove your dressing in 24 hours. Leave stitches (sutures), skin glue, or adhesive strips in place. These skin closures may need to stay in place for 2 weeks or longer. If adhesive strip edges start to loosen and curl up, you may trim the loose edges. Do not remove adhesive strips completely unless your health care provider tells you to do that. Do not take baths, swim, or use a hot tub for at least 1 week. You may shower 24 hours after the procedure or as told by your health care provider. Remove the dressing and gently wash the incision area with plain soap and water. Pat the area dry with a clean towel. Do not rub the site. That could cause bleeding. Do not apply powder or lotion to the site. Check your incision site every day for signs of infection. Check for: Redness, swelling, or pain. Fluid or blood. Warmth. Pus or a bad smell. Activity For 24 hours after the procedure, or as directed by your health care provider: Do not flex or bend the affected arm. Do not push or pull heavy objects with the affected arm. Do not operate machinery or power tools. Do not drive. You should not drive yourself home from the hospital or clinic if you go home during that time period. You may drive 24  hours after the procedure unless your health care provider tells you not to. Do not lift anything that is heavier than 10 lb (4.5 kg), or the limit that you are told, until your health care provider says that it is safe. Return to your normal activities as told by your health care provider. Ask your health care provider what activities are safe for you and when you can return to work. If you were given a sedative during the procedure, it can affect you for several hours. Do not drive or operate machinery until your health care provider says that it is safe. General instructions Take over-the-counter and prescription medicines only as told by your health care provider. If you will be going home right after the procedure, plan to have a responsible adult care for you for the time you are told. This is important. Keep all follow-up visits. This is important. Contact a health care provider if: You have a fever or chills. You have any of these signs of infection at your incision site: Redness, swelling, or pain. Fluid or blood. Warmth. Pus or a bad smell. Get help right away if: The incision area swells very fast. The incision area is bleeding, and the bleeding does not stop when you hold steady pressure on the area. Your arm or hand becomes pale, cool, tingly, or numb. These symptoms may represent a serious problem that is an emergency. Do not wait to see if the symptoms will go away. Get medical  help right away. Call your local emergency services (911 in the [place] Do not drive yourself to the hospital. Summary After the procedure, it is common to have bruising and tenderness at the incision site. Follow instructions from your health care provider about how to take care of your radial site incision. Check the incision every day for signs of infection. Do not lift anything that is heavier than 10 lb (4.5 kg), or the limit that you are told, until your health care provider says that it is  safe. Get help right away if the incision area swells very fast, you have bleeding at the incision site that will not stop, or your arm or hand becomes pale, cool, or numb. This information is not intended to replace advice given to you by your health care provider. Make sure you discuss any questions you have with your health care provider. Document Revised: 03/10/2020 Document Reviewed: 03/10/2020 Elsevier Patient Education  2024 [employer].

## 2023-07-05 NOTE — Interval H&P Note (Signed)
 History and Physical Interval Note:  07/05/2023 8:25 AM  Wesley Barry  has presented today for surgery, with the diagnosis of cad with persistent - Progressive/Unstable Angina.  The various methods of treatment have been discussed with the patient and family. After consideration of risks, benefits and other options for treatment, the patient has consented to  Procedure(s): CORONARY STENT INTERVENTION (N/A) as a surgical intervention.  The patient's history has been reviewed, patient examined, no change in status, stable for surgery.  I have reviewed the patient's chart and labs.  Questions were answered to the patient's satisfaction.    Cath Lab Visit (complete for each Cath Lab visit)  Clinical Evaluation Leading to the Procedure:   ACS: No.  Non-ACS:    Anginal Classification: CCS IV  Anti-ischemic medical therapy: Maximal Therapy (2 or more classes of medications)  Non-Invasive Test Results: High-risk stress test findings: cardiac mortality >3%/year  Prior CABG: No previous CABG   Randene Bustard

## 2023-07-05 NOTE — Progress Notes (Signed)
 CARDIAC REHAB PHASE I     Post stent education including site care, restrictions, risk factors, exercise guidelines, NTG use, antiplatelet therapy importance, heart healthy diabetic diet and CRP2 reviewed. All questions and concerns addressed. Will refer to [facility] for CRP2. Plan for home later today.    [id number] Ronny Colas, RN BSN 07/05/2023 11:31 AM

## 2023-07-05 NOTE — Discharge Summary (Signed)
 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  Mid LAD-2 lesion is 70% stenosed.  Mid LAD-3 lesion is 55% stenosed.   A drug-eluting stent was successfully placed covering all 3 lesions, using a STENT ONYX FRONTIER [id number].  Stent was postdilated to 2.5 mm. Post intervention, there is a 0% residual stenosis throughout.   Dist LAD lesion is 60% stenosed.   Mid LAD-3 lesion is 55% stenosed.   Post intervention, there is a 0% residual stenosis.   Post intervention, there is a 0% residual stenosis.   Diagnostic Dominance: Right                                                               Intervention    Successful OCT Guided DES PCI of mid LAD: Onyx Frontier DES 2.25 x 38 mm (post-dilated to 2.5 mm)  Moderately elevated EDP      RECOMMENDATIONS   In the absence of any other complications or medical issues, we expect the patient to be ready for discharge from an interventional cardiology perspective on 07/05/2023.   Continue aggressive risk factor modification.   Recommend uninterrupted dual antiplatelet therapy with Aspirin  81mg  daily and Clopidogrel  75mg  daily for a minimum of 12 months (ACS-Class I recommendation).   Based on lesion length, would recommend 12 months of Plavix  minimum, but could stop aspirin  in 6 months if necessary for bleeding or  bruising.  Consider additional 1 year of Plavix  if tolerating. _____________   History of Present Illness     Wesley Barry is a 68 y.o. male with history of CAD, cardiomyopathy with HFmrEF, CVA, 50 pack year smoking history, type II diabetes, alcohol use.   Patient was seen in clinic on 06/24/23 by Laneta Pintos, NP reporting continued symptoms of exertional dyspnea, limiting is tolerance of physical activity. Patient also reported chest pain at least a few times per week with exertion, relieved by rest. Given LHC in March of 2025 that revealed tandem lesions in LAD and ongoing symptoms, cardiac catheterization was arranged for further evaluation.  Hospital Course     The patient underwent cardiac cath as noted above with Dr. Addie Holstein. Plan for DAPT with ASA/Plavix  for at least 1 year. The patient was seen by cardiac rehab while in short stay. There were no observed complications post cath. Radial cath site was re-evaluated prior to discharge and found to be stable without any complications. Instructions/precautions regarding cath site care were given prior to discharge.  Wesley Barry was seen by Dr. Addie Holstein and myself and determined stable for discharge home. Follow up with our office has been arranged. Medications are listed below. Per Dr. Addie Holstein, recommend  uninterrupted dual antiplatelet therapy with Aspirin  81mg  daily and Clopidogrel  75mg  daily for a minimum of 12 months.   In reviewing patient's medications for discharge, there appeared to be a discrepancy between pharmacy fills and listed medications per last office visit. Discussed Entresto  and SGLT2 with patient. He reports not taking Entresto  due to cost barrier. He believes he has Jardiance  at home/is taking. I also inquired about Losartan  as this also previously appears in medications. Patient is not sure if he still has this/is taking. Given confusion, I've asked patient to collect all prescriptions at home and call the clinic  tomorrow to review with nursing staff.     _____________  Cath/PCI Registry Performance & Quality Measures: Aspirin  prescribed? - Yes ADP Receptor Inhibitor (Plavix /Clopidogrel , Brilinta/Ticagrelor or Effient/Prasugrel) prescribed (includes medically managed patients)? - Yes High Intensity Statin (Lipitor  40-80mg  or Crestor 20-40mg ) prescribed? - Yes For EF <40%, was ACEI/ARB prescribed? - Not Applicable (EF >/= 40%) For EF <40%, Aldosterone Antagonist (Spironolactone or Eplerenone) prescribed? - Not Applicable (EF >/= 40%) Cardiac Rehab Phase II ordered (Included Medically managed Patients)? - Yes  _____________   Discharge Vitals Blood pressure (!) 156/74, pulse (!) 59, temperature 98.6 F (37 C), temperature source Oral, resp. rate 15, height 5\' 9"  (1.753 m), weight 89.4 kg, SpO2 97%.  Filed Weights   07/05/23 0754  Weight: 89.4 kg    Last Labs & Radiologic Studies    CBC No results for input(s): "WBC", "NEUTROABS", "HGB", "HCT", "MCV", "PLT" in the last 72 hours. Basic Metabolic Panel No results for input(s): "NA", "K", "CL", "CO2", "GLUCOSE", "BUN", "CREATININE", "CALCIUM ", "MG", "PHOS" in the last 72 hours. Liver Function Tests No results for input(s): "AST", "ALT", "ALKPHOS", "BILITOT", "PROT", "ALBUMIN" in the last 72 hours. No results for input(s): "LIPASE", "AMYLASE" in the last 72 hours. High Sensitivity Troponin:   No results for input(s): "TROPONINIHS" in the last 720 hours.  BNP Invalid input(s): "POCBNP" D-Dimer No results for input(s): "DDIMER" in the last 72 hours. Hemoglobin A1C No results for input(s): "HGBA1C" in the last 72 hours. Fasting Lipid Panel No results for input(s): "CHOL", "HDL", "LDLCALC", "TRIG", "CHOLHDL", "LDLDIRECT" in the last 72 hours. Thyroid Function Tests No results for input(s): "TSH", "T4TOTAL", "T3FREE", "THYROIDAB" in the last 72 hours.  Invalid input(s): "FREET3" _____________  CARDIAC CATHETERIZATION Result Date:  07/05/2023 Images from the original result were not included.   CULPRIT LESION SEGMENT: Mid LAD-1 lesion is 45% stenosed.  Mid LAD-2 lesion is 70% stenosed.  Mid LAD-3 lesion is 55% stenosed.   A drug-eluting stent was successfully placed covering all 3 lesions, using a STENT ONYX FRONTIER [id number].  Stent was postdilated to 2.5 mm. Post intervention, there is a 0% residual stenosis throughout.   Dist LAD lesion is 60% stenosed.   Mid LAD-3 lesion is 55% stenosed.   Post intervention, there is a 0% residual stenosis.   Post intervention, there is a 0% residual stenosis. Diagnostic Dominance: Right      Intervention Successful OCT Guided DES PCI of mid LAD: Onyx Frontier DES 2.25 x 38 mm (post-dilated to 2.5 mm) Moderately elevated EDP RECOMMENDATIONS   In the absence of any other complications or medical issues, we expect the patient to be ready for discharge from an interventional cardiology perspective on 07/05/2023.   Continue aggressive risk factor modification.   Recommend uninterrupted dual antiplatelet therapy with Aspirin  81mg  daily and Clopidogrel  75mg  daily for a minimum of 12 months (ACS-Class I recommendation).   Based on lesion length,  would recommend 12 months of Plavix  minimum, but could stop aspirin  in 6 months if necessary for bleeding or bruising.  Consider additional 1 year of Plavix  if tolerating. Randene Bustard, MD   Disposition   Pt is being discharged home today in good condition.  Follow-up Plans & Appointments     Discharge Instructions     AMB Referral to Cardiac Rehabilitation - Phase II   Complete by: As directed    Diagnosis:  Coronary Stents Stable Angina     After initial evaluation and assessments completed: Virtual Based Care may be provided alone or in conjunction with Phase 2 Cardiac Rehab based on patient barriers.: Yes   Intensive Cardiac Rehabilitation (ICR) [facility] location only OR Traditional Cardiac Rehabilitation (TCR) *If criteria for ICR are not met will enroll in  TCR [facility] only): Yes        Discharge Medications   Allergies as of 07/05/2023       Reactions   Tylenol  With Codeine #3 [acetaminophen -codeine] Shortness Of Breath   Codeine         Medication List     TAKE these medications    aspirin  EC 81 MG tablet Take 1 tablet (81 mg total) by mouth daily. Swallow whole.   clopidogrel  75 MG tablet Commonly known as: PLAVIX  Take 1 tablet (75 mg total) by mouth daily.   folic acid  1 MG tablet Commonly known as: FOLVITE  Take 1 tablet (1 mg total) by mouth daily.   isosorbide  mononitrate 60 MG 24 hr tablet Commonly known as: IMDUR  Take 1 tablet (60 mg total) by mouth daily. What changed: how much to take   metoprolol  succinate 50 MG 24 hr tablet Commonly known as: TOPROL -XL Take 1 tablet (50 mg total) by mouth daily. Take with or immediately following a meal.   nitroGLYCERIN  0.4 MG SL tablet Commonly known as: NITROSTAT  Place 1 tablet (0.4 mg total) under the tongue every 5 (five) minutes as needed for chest pain.   rosuvastatin 40 MG tablet Commonly known as: CRESTOR Take 40 mg by mouth daily.           Allergies Allergies  Allergen Reactions   Tylenol  With Codeine #3 [Acetaminophen -Codeine] Shortness Of Breath    Codeine     Outstanding Labs/Studies   Duration of Discharge Encounter   Greater than 30 minutes including physician time.  Coby Darting, PA-C 07/05/2023, 10:21 AM

## 2023-07-05 NOTE — Telephone Encounter (Signed)
 Patient Product/process development scientist completed.    The patient is insured through [place] [employer]. Patient has Medicare and is not eligible for a copay card, but may be able to apply for patient assistance or Medicare RX Payment Plan (Patient Must reach out to their plan, if eligible for payment plan), if available.    Ran test claim for Entresto  24-26 mg and the current 30 day co-pay is $285.52 due to deductible.  Ran test claim for Jardiance  10 mg and the current 30 day co-pay is $285.52 due to deductible.  Ran test claim for Farxiga  10 mg and the current 30 day co-pay is $385.00 due to deductible.  This test claim was processed through  [facility]- copay amounts may vary at other pharmacies due to pharmacy/plan contracts, or as the patient moves through the different stages of their insurance plan.     Morgan Arab, CPHT Pharmacy Technician III Certified Patient Advocate [facility] Pharmacy Patient Advocate Team Direct Number: [phone]  Fax: [phone]

## 2023-07-06 ENCOUNTER — Telehealth: Payer: Self-pay | Admitting: Cardiovascular Disease

## 2023-07-06 NOTE — Telephone Encounter (Signed)
 Wife called to just confirm patient medication list. Please advise

## 2023-07-06 NOTE — Telephone Encounter (Signed)
 Spoke with patient and he gave permission for me to speak with Wesley Barry regarding his medication list.  Wesley Barry stated that she was informed by Dr. Addie Holstein to call with patient's most updated medication list; current medication list is up to date.

## 2023-07-06 NOTE — Telephone Encounter (Signed)
 Not aware of that = but he just had PCI yesterday = should have f/u with Marvel Slicker, NP soon.  D/c meds from post PCI should be correct. DH

## 2023-07-28 ENCOUNTER — Encounter: Payer: Self-pay | Admitting: Nurse Practitioner

## 2023-07-28 ENCOUNTER — Ambulatory Visit: Attending: Nurse Practitioner | Admitting: Nurse Practitioner

## 2023-07-28 VITALS — BP 130/80 | HR 78 | Ht 69.0 in | Wt 197.4 lb

## 2023-07-28 DIAGNOSIS — I2510 Atherosclerotic heart disease of native coronary artery without angina pectoris: Secondary | ICD-10-CM | POA: Diagnosis not present

## 2023-07-28 DIAGNOSIS — I428 Other cardiomyopathies: Secondary | ICD-10-CM

## 2023-07-28 DIAGNOSIS — I5022 Chronic systolic (congestive) heart failure: Secondary | ICD-10-CM | POA: Diagnosis not present

## 2023-07-28 DIAGNOSIS — E1165 Type 2 diabetes mellitus with hyperglycemia: Secondary | ICD-10-CM

## 2023-07-28 DIAGNOSIS — E785 Hyperlipidemia, unspecified: Secondary | ICD-10-CM

## 2023-07-28 MED ORDER — LOSARTAN POTASSIUM 25 MG PO TABS: 25.0000 mg | ORAL_TABLET | Freq: Every day | ORAL | 6 refills | Status: DC

## 2023-07-28 NOTE — Patient Instructions (Signed)
 Medication Instructions:  Your physician recommends the following medication changes.  START TAKING: Losartan  25 mg daily  *If you need a refill on your cardiac medications before your next appointment, please call your pharmacy*  Lab Work: Your provider would like for you to return in 1 week to have the following labs drawn: BMeT.   Please go to [facility] [address] [address], [place] [address] You do not need an appointment.  They are open from 8 am- 4:30 pm.  Lunch from 1:00 pm- 2:00 pm You DO NOT need to be fasting.  If you have labs (blood work) drawn today and your tests are completely normal, you will receive your results only by: MyChart Message (if you have MyChart) OR A paper copy in the mail If you have any lab test that is abnormal or we need to change your treatment, we will call you to review the results.  Follow-Up: At [facility], you and your health needs are our priority.  As part of our continuing mission to provide you with exceptional heart care, our providers are all part of one team.  This team includes your primary Cardiologist (physician) and Advanced Practice Providers or APPs (Physician Assistants and Nurse Practitioners) who all work together to provide you with the care you need, when you need it.  Your next appointment:   6 week(s)  Provider:   You may see Timothy Gollan, MD or Lonni Meager, NP  We recommend signing up for the patient portal called MyChart.  Sign up information is provided on this After Visit Summary.  MyChart is used to connect with patients for Virtual Visits (Telemedicine).  Patients are able to view lab/test results, encounter notes, upcoming appointments, etc.  Non-urgent messages can be sent to your provider as well.   To learn more about what you can do with MyChart, go to [email or web address].

## 2023-07-28 NOTE — Progress Notes (Signed)
 Office Visit    Patient Name: Wesley Barry Date of Encounter: 07/28/2023  Primary Care Provider:  Lorel Maxie LABOR, MD Primary Cardiologist:  Evalene Lunger, MD  Chief Complaint    68 y.o. 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  Past Medical History   Subjective   Past Medical History:  Diagnosis Date   Alcohol abuse    CAD (coronary artery disease)    a. 04/2023 Cor CTA: LAD 50-46m (FFRct 0.78), LCX 25-80m (FFRct 0.97), RCA 25-54m (FFRct 0.93). Cor Ca2+ = 2282 (95th%'ile); b. 04/2023 Cath: LM nl, LAD 45/65/55 mid - tandem lesions, D1/2/3 small, LCX nl, OM1 nl, RCA large, nl, RPAV/RPL nl, EF 45-50%; c. 07/2023 PCI: LAD 45/70/62m (2.25x38 Onyx Frontier DES).   Cardiomyopathy (HCC)    a. 06/2022 Echo: EF 40-45%.  Global HK.  Severe LVH.  Grade 1 DD.  Nl RV fxn. Mild AI.   Heart failure with mid-range ejection fraction [facility])    History of shingles 2021   Morbid obesity (HCC)    Stroke (HCC)    a. 06/2022 MRI: Acute infarct of the left basal ganglia; b. 06/2022 Zio: Predominantly sinus rhythm, average 92 (65-164).  1, 7 beat run of nonsustained SVT.  Rare PACs/PVCs.  No A-fib or sustained arrhythmias.   Tobacco abuse    Type 2 diabetes mellitus (HCC)    Past Surgical History:  Procedure Laterality Date   CORONARY IMAGING/OCT N/A 07/05/2023   Procedure: CORONARY IMAGING/OCT;  Surgeon: Anner Alm ORN, MD;  Location: [facility] INVASIVE CV LAB;  Service: Cardiovascular;  Laterality: N/A;   CORONARY STENT INTERVENTION N/A 07/05/2023   Procedure: CORONARY STENT INTERVENTION;  Surgeon: Anner Alm ORN, MD;  Location: [facility] INVASIVE CV LAB;  Service: Cardiovascular;  Laterality: N/A;   HERNIA REPAIR     LEFT HEART CATH N/A 07/05/2023   Procedure: Left Heart Cath;  Surgeon: Anner Alm ORN, MD;  Location: [facility] INVASIVE CV LAB;  Service: Cardiovascular;  Laterality: N/A;   LEFT HEART CATH AND CORONARY ANGIOGRAPHY Left 04/29/2023   Procedure: LEFT HEART CATH AND CORONARY ANGIOGRAPHY;  Surgeon: Anner Alm ORN, MD;  Location: [facility] INVASIVE CV LAB;  Service: Cardiovascular;  Laterality: Left;    Allergies  Allergies  Allergen Reactions   Tylenol  With Codeine #3 [Acetaminophen -Codeine] Shortness Of Breath    Codeine        History of Present Illness      68 y.o. y/o male with a history of nonobstructive CAD, nonischemic cardiomyopathy/HFmrEF, stroke (May 2024), 50-pack-year history tobacco abuse, obesity, shingles, diabetes, and alcohol use.  Wesley Barry was previously evaluated in May 2023 in the setting of dyspnea, chest pain, and palpitations.  A cardiac CTA was ordered however, patient did not follow through.  In May 2024, patient presented to [facility] with right-sided weakness and a code stroke was called.  MRI of the brain showed acute infarct of the left basal ganglia.  He was not a candidate for tPA.  An echo was performed and showed an EF of 40 to 45% with global hypokinesis, severe LVH, grade 1 diastolic dysfunction, normal RV function, and mild AI.  He was seen by our team and placed on GDMT with recommendation for outpatient workup of cardiomyopathy, following recovery from stroke.  Subsequent outpatient monitoring showed predominantly sinus rhythm  with 1 brief run of SVT, and no episodes of A-fib/flutter.  After missing several appointments, Wesley Barry followed-up in January 2025 at which time he was doing well without chest pain or dyspnea, though activity was limited.  In the setting of prior diagnosis of cardiomyopathy, coronary CT angiogram was undertaken and showed significant LAD disease with FFR CT of 0.78.  He was seen back in clinic in March 2025 and set up for diagnostic catheterization, which showed tandem moderate stenoses in the LAD of 45, 65,  and 55% with otherwise nonobstructive disease.  Recommendation was made for medical therapy and he was placed on long-acting nitrate, though PCI would be considered for recalcitrant symptoms.  Patient reported ongoing dyspnea on exertion and exertional angina despite titration of outpatient nitrate therapy in May 2025.  Films were reviewed with Dr. Anner and patient subsequently underwent successful OCT guided PCI and drug-eluting stent placement of the mid LAD.     Since discharge, he has felt dramatically better.  He is now able to walk without chest pain or dyspnea.  He denies palpitations, PND, orthopnea, dizziness, syncope, edema, or early satiety.  He was not planning on pursuing cardiac rehabilitation but after some discussion, he is reconsidering.  In discussing his medicines today, it was noted during hospitalization that he was not taking Entresto  or SGLT2 inhibitor.  He says today that they were too expensive.  He is willing to accept prescription for losartan . Objective   Home Medications    Current Outpatient Medications  Medication Sig Dispense Refill   aspirin  EC 81 MG tablet Take 1 tablet (81 mg total) by mouth daily. Swallow whole. 30 tablet 3   clopidogrel  (PLAVIX ) 75 MG tablet Take 1 tablet (75 mg total) by mouth daily. 30 tablet 6   folic acid  (FOLVITE ) 1 MG tablet Take 1 tablet (1 mg total) by mouth daily. 30 tablet 0   isosorbide  mononitrate (IMDUR ) 60 MG 24 hr tablet Take 1 tablet (60 mg total) by mouth daily. 90 tablet 3   losartan  (COZAAR ) 25 MG tablet Take 1 tablet (25 mg total) by mouth daily. 30 tablet 6   metoprolol  succinate (TOPROL -XL) 50 MG 24 hr tablet Take 1 tablet (50 mg total) by mouth daily. Take with or immediately following a meal. 30 tablet 3   nitroGLYCERIN  (NITROSTAT ) 0.4 MG SL tablet Place 1 tablet (0.4 mg total) under the tongue every 5 (five) minutes as needed for chest pain. 25 tablet 1   rosuvastatin (CRESTOR) 40 MG tablet Take 40 mg by mouth daily.      No current facility-administered medications for this visit.     Physical Exam    VS:  BP 130/80 (BP Location: Left Arm, Patient Position: Sitting, Cuff Size: Normal)   Pulse 78   Ht 5' 9 (1.753 m)   Wt 197 lb 6 oz (89.5 kg)   SpO2 97%   BMI 29.15 kg/m  , BMI Body mass index is 29.15 kg/m.          GEN: Well nourished, well developed, in no acute distress. HEENT: normal. Neck: Supple, no JVD, carotid bruits, or masses. Cardiac: RRR, no murmurs, rubs, or gallops. No clubbing, cyanosis, edema.  Radials 2+/PT 2+ and equal bilaterally.  R radial catheterization site without bleeding, bruit, or hematoma. Respiratory:  Respirations regular and unlabored, clear to auscultation bilaterally. GI: Soft, nontender, nondistended, BS + x 4. MS: no deformity or atrophy. Skin: warm and dry, no rash. Neuro: Right upper extremity weakness.  Psych: Normal affect.  Accessory Clinical Findings    ECG personally reviewed by me today - EKG Interpretation Date/Time:  Wednesday July 28 2023 08:36:06 EDT Ventricular Rate:  78 PR Interval:  152 QRS Duration:  112 QT Interval:  404 QTC Calculation: 460 R Axis:   -32  Text Interpretation: Normal sinus rhythm Left axis deviation Confirmed by Vivienne Bruckner [phone]) on 07/28/2023 8:44:17 AM  - no acute changes.  Lab Results  Component Value Date   WBC 5.3 06/24/2023   HGB 12.1 (L) 06/24/2023   HCT 38.1 06/24/2023   MCV 89 06/24/2023   PLT 279 06/24/2023   Lab Results  Component Value Date   CREATININE 1.24 06/24/2023   BUN 15 06/24/2023   NA 144 06/24/2023   K 4.0 06/24/2023   CL 108 (H) 06/24/2023   CO2 18 (L) 06/24/2023   Lab Results  Component Value Date   ALT 20 05/20/2023   AST 22 05/20/2023   ALKPHOS 37 (L) 05/20/2023   BILITOT 0.5 05/20/2023   Lab Results  Component Value Date   CHOL 109 05/20/2023   HDL 36 (L) 05/20/2023   LDLCALC 57 05/20/2023   TRIG 82 05/20/2023   CHOLHDL 3.0 05/20/2023    Lab Results   Component Value Date   HGBA1C 11.5 (H) 06/16/2022   Lab Results  Component Value Date   TSH 1.557 06/17/2022       Assessment & Plan    1.  Coronary artery disease: Diagnostic catheterization March 2025 with 3 tandem lesions in the LAD with initial recommendation for medical therapy however, in the setting of ongoing symptoms, he underwent PCI and drug-eluting stent placement to the mid LAD in May 2025.  Since then, he has noted dramatic improvement in exercise tolerance without chest pain or dyspnea.  He was initially reluctant to consider cardiac rehab but is now reconsidering following our conversation.  I strongly encouraged that he attend.  He remains on aspirin , Plavix , beta-blocker, statin, and long-acting nitrate therapy.  2.  Nonischemic cardiomyopathy/chronic heart failure with midrange ejection fraction: EF 40 to 45% by echo May 2024 with global hypokinesis.  He is euvolemic on examination and feeling well.  He was previously on Entresto  and SGLT2 inhibitor however due to cost, he discontinued both.  He is willing to accept a prescription for losartan , which was prescribed today-25 mg daily.  Continue Toprol -XL.  Can consider transition from losartan  to Entresto  in the future once Entresto  becomes generic, provided that is affordable.  3.  Hyperlipidemia: LDL of 57 in April with normal LFTs.  Continue rosuvastatin.  4.  History of stroke: Chronic right upper extremity hemiplegia.  Prior event monitor did not show any A-fib/flutter.  Remains on aspirin , statin, and Plavix .  5.  Prior alcohol and tobacco abuse: No alcohol or tobacco since stroke in 2024.  6.  Type 2 diabetes mellitus: A1c 11.5 in May 2024.  He is followed by primary care.  He was previously on Lantus  and Jardiance .  He could not afford Jardiance  and stopped it.  Lantus  has fallen off of his list that he said he is taking.  Follow-up with primary care.  7.  Disposition: Follow-up basic metabolic panel in 1 week after  starting losartan .  Follow-up in clinic in 6 weeks or sooner if necessary.  Bruckner Vivienne, NP 07/28/2023, 9:04 AM

## 2023-08-05 ENCOUNTER — Ambulatory Visit: Payer: Self-pay | Admitting: Nurse Practitioner

## 2023-08-05 LAB — BASIC METABOLIC PANEL WITH GFR
BUN/Creatinine Ratio: 8 — ABNORMAL LOW (ref 10–24)
BUN: 10 mg/dL (ref 8–27)
CO2: 19 mmol/L — ABNORMAL LOW (ref 20–29)
Calcium: 9.4 mg/dL (ref 8.6–10.2)
Chloride: 104 mmol/L (ref 96–106)
Creatinine, Ser: 1.23 mg/dL (ref 0.76–1.27)
Glucose: 134 mg/dL — ABNORMAL HIGH (ref 70–99)
Potassium: 4.2 mmol/L (ref 3.5–5.2)
Sodium: 142 mmol/L (ref 134–144)
eGFR: 64 mL/min/{1.73_m2} (ref >59)

## 2023-09-14 ENCOUNTER — Ambulatory Visit: Attending: Nurse Practitioner | Admitting: Nurse Practitioner

## 2023-09-14 NOTE — Progress Notes (Deleted)
 Office Visit    Patient Name: Wesley Barry Date of Encounter: 09/14/2023  Primary Care Provider:  Lorel Maxie LABOR, MD Primary Cardiologist:  Evalene Lunger, MD    Chief Complaint    68 y.o. 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  Past Medical History   Subjective   Past Medical History:  Diagnosis Date   Alcohol abuse    CAD (coronary artery disease)    a. 04/2023 Cor CTA: LAD 50-60m (FFRct 0.78), LCX 25-58m (FFRct 0.97), RCA 25-33m (FFRct 0.93). Cor Ca2+ = 2282 (95th%'ile); b. 04/2023 Cath: LM nl, LAD 45/65/55 mid - tandem lesions, D1/2/3 small, LCX nl, OM1 nl, RCA large, nl, RPAV/RPL nl, EF 45-50%; c. 07/2023 PCI: LAD 45/70/24m (2.25x38 Onyx Frontier DES).   Cardiomyopathy (HCC)    a. 06/2022 Echo: EF 40-45%.  Global HK.  Severe LVH.  Grade 1 DD.  Nl RV fxn. Mild AI.   Heart failure with mid-range ejection fraction [facility])    History of shingles 2021   Morbid obesity (HCC)    Stroke (HCC)    a. 06/2022 MRI: Acute infarct of the left basal ganglia; b. 06/2022 Zio: Predominantly sinus rhythm, average 92 (65-164).  1, 7 beat run of nonsustained SVT.  Rare PACs/PVCs.  No A-fib or sustained arrhythmias.   Tobacco abuse    Type 2 diabetes mellitus (HCC)    Past Surgical History:  Procedure Laterality Date   CORONARY IMAGING/OCT N/A 07/05/2023   Procedure: CORONARY IMAGING/OCT;  Surgeon: Anner Alm ORN, MD;  Location: [facility] INVASIVE CV LAB;  Service: Cardiovascular;  Laterality: N/A;   CORONARY STENT INTERVENTION N/A 07/05/2023   Procedure: CORONARY STENT INTERVENTION;  Surgeon: Anner Alm ORN, MD;  Location: [facility] INVASIVE CV LAB;  Service: Cardiovascular;  Laterality: N/A;   HERNIA REPAIR     LEFT HEART CATH N/A 07/05/2023   Procedure: Left Heart Cath;   Surgeon: Anner Alm ORN, MD;  Location: [facility] INVASIVE CV LAB;  Service: Cardiovascular;  Laterality: N/A;   LEFT HEART CATH AND CORONARY ANGIOGRAPHY Left 04/29/2023   Procedure: LEFT HEART CATH AND CORONARY ANGIOGRAPHY;  Surgeon: Anner Alm ORN, MD;  Location: [facility] INVASIVE CV LAB;  Service: Cardiovascular;  Laterality: Left;    Allergies  Allergies  Allergen Reactions   Tylenol  With Codeine #3 [Acetaminophen -Codeine] Shortness Of Breath    Codeine        History of Present Illness      68 y.o. y/o male with a history of nonobstructive CAD, nonischemic cardiomyopathy/HFmrEF, stroke (May 2024), 50-pack-year history tobacco abuse, obesity, shingles, diabetes, and alcohol use.  Mr. Rogan was previously evaluated in May 2023 in the setting of dyspnea, chest pain, and palpitations.  A cardiac CTA was ordered however, patient did not follow through.  In May 2024, patient presented to [facility] with right-sided weakness and a code stroke was called.  MRI of the brain showed acute infarct of the left basal ganglia.  He was not a candidate for tPA.  An echo was performed and showed an EF of 40 to 45% with global hypokinesis, severe LVH, grade 1 diastolic dysfunction, normal RV function, and mild AI.  He was seen by our team and placed on GDMT with recommendation for outpatient workup of cardiomyopathy, following recovery from stroke.  Subsequent outpatient  monitoring showed predominantly sinus rhythm with 1 brief run of SVT, and no episodes of A-fib/flutter.  After missing several appointments, Mr. Dobosz followed-up in January 2025 at which time he was doing well without chest pain or dyspnea, though activity was limited.  In the setting of prior diagnosis of cardiomyopathy, coronary CT angiogram was undertaken and showed significant LAD disease with FFR CT of 0.78.  He was seen back in clinic in March 2025 and set up for diagnostic catheterization, which showed tandem moderate stenoses in  the LAD of 45, 65, and 55% with otherwise nonobstructive disease.  Recommendation was made for medical therapy and he was placed on long-acting nitrate, though PCI would be considered for recalcitrant symptoms.  Patient reported ongoing dyspnea on exertion and exertional angina despite titration of outpatient nitrate therapy in May 2025.  Films were reviewed with Dr. Anner and patient subsequently underwent successful OCT guided PCI and drug-eluting stent placement of the mid LAD.       Mr. Krantz was last seen in cardiology clinic in June 2025, at which time he noted dramatic improvement in activity tolerance without chest pain or dyspnea.  1.  CAD: Diagnostic catheterization March 2025 showed 3 tandem lesions in the LAD with initial recommendation for medical therapy however, in the setting of ongoing symptoms, he underwent PCI drug-eluting stent placement to the mid LAD in May 2025.  2.  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  ***Follow-up echocardiogram 90 days post revascularization to reevaluate LV function.  Continue beta-blocker and ARB (Entresto  cost prohibitive).  3.  Hyperlipidemia: LDL 57 in April 2025 with normal LFTs.  Continue rosuvastatin.  4.  History of stroke: Chronic right upper extremity hemiplegia.  Prior event monitor did not show any A-fib/flutter.  He remains on aspirin , statin, and Plavix .  5.  Prior alcohol and tobacco abuse: No alcohol or tobacco since stroke 2024.  6.  Type 2 diabetes mellitus: A1c ***  7.  Disposition: Objective   Home Medications    Current Outpatient Medications  Medication Sig Dispense Refill   aspirin  EC 81 MG tablet Take 1 tablet (81 mg total) by mouth daily. Swallow whole. 30 tablet 3   clopidogrel  (PLAVIX ) 75 MG tablet Take 1 tablet (75 mg total) by mouth daily. 30 tablet 6   folic acid  (FOLVITE ) 1 MG tablet Take 1  tablet (1 mg total) by mouth daily. 30 tablet 0   isosorbide  mononitrate (IMDUR ) 60 MG 24 hr tablet Take 1 tablet (60 mg total) by mouth daily. 90 tablet 3   losartan  (COZAAR ) 25 MG tablet Take 1 tablet (25 mg total) by mouth daily. 30 tablet 6   metoprolol  succinate (TOPROL -XL) 50 MG 24 hr tablet Take 1 tablet (50 mg total) by mouth daily. Take with or immediately following a meal. 30 tablet 3   nitroGLYCERIN  (NITROSTAT ) 0.4 MG SL tablet Place 1 tablet (0.4 mg total) under the tongue every 5 (five) minutes as needed for chest pain. 25 tablet 1   rosuvastatin (CRESTOR) 40 MG tablet Take 40 mg by mouth daily.     No current facility-administered medications for this visit.     Physical Exam    VS:  There were no vitals taken for this visit. , BMI There is no height or weight on file to calculate BMI.          GEN: Well nourished, well developed, in no  acute distress. HEENT: normal. Neck: Supple, no JVD, carotid bruits, or masses. Cardiac: RRR, no murmurs, rubs, or gallops. No clubbing, cyanosis, edema.  Radials 2+/PT 2+ and equal bilaterally.  Respiratory:  Respirations regular and unlabored, clear to auscultation bilaterally. GI: Soft, nontender, nondistended, BS + x 4. MS: no deformity or atrophy. Skin: warm and dry, no rash. Neuro:  Strength and sensation are intact. Psych: Normal affect.  Accessory Clinical Findings    ECG personally reviewed by me today -    *** - no acute changes.  Lab Results  Component Value Date   WBC 5.3 06/24/2023   HGB 12.1 (L) 06/24/2023   HCT 38.1 06/24/2023   MCV 89 06/24/2023   PLT 279 06/24/2023   Lab Results  Component Value Date   CREATININE 1.23 08/04/2023   BUN 10 08/04/2023   NA 142 08/04/2023   K 4.2 08/04/2023   CL 104 08/04/2023   CO2 19 (L) 08/04/2023   Lab Results  Component Value Date   ALT 20 05/20/2023   AST 22 05/20/2023   ALKPHOS 37 (L) 05/20/2023   BILITOT 0.5 05/20/2023   Lab Results  Component Value Date    CHOL 109 05/20/2023   HDL 36 (L) 05/20/2023   LDLCALC 57 05/20/2023   TRIG 82 05/20/2023   CHOLHDL 3.0 05/20/2023    Lab Results  Component Value Date   HGBA1C 11.5 (H) 06/16/2022   Lab Results  Component Value Date   TSH 1.557 06/17/2022       Assessment & Plan    1.  ***  Lonni Meager, NP 09/14/2023, 8:12 AM

## 2024-01-20 ENCOUNTER — Other Ambulatory Visit: Payer: Self-pay | Admitting: Nurse Practitioner
